# Patient Record
Sex: Male | Born: 1980 | Hispanic: No | Marital: Single | Smoking: Current every day smoker
Health system: Southern US, Community
[De-identification: ages and names within clinical notes are randomized; demographics above are authoritative.]

---

## 2009-09-22 ENCOUNTER — Emergency Department (HOSPITAL_COMMUNITY): Admission: EM | Admit: 2009-09-22 | Discharge: 2009-09-22 | Payer: Self-pay | Admitting: Emergency Medicine

## 2010-09-19 ENCOUNTER — Emergency Department: Payer: Self-pay | Admitting: Emergency Medicine

## 2010-11-02 ENCOUNTER — Emergency Department: Payer: Self-pay | Admitting: Emergency Medicine

## 2010-11-18 ENCOUNTER — Emergency Department: Payer: Self-pay | Admitting: Emergency Medicine

## 2010-12-25 ENCOUNTER — Emergency Department: Payer: Self-pay | Admitting: Unknown Physician Specialty

## 2011-06-26 ENCOUNTER — Emergency Department: Payer: Self-pay | Admitting: *Deleted

## 2011-07-11 ENCOUNTER — Emergency Department: Payer: Self-pay | Admitting: Unknown Physician Specialty

## 2011-09-06 ENCOUNTER — Emergency Department: Payer: Self-pay | Admitting: Internal Medicine

## 2011-11-12 ENCOUNTER — Emergency Department: Payer: Self-pay | Admitting: Emergency Medicine

## 2011-11-12 LAB — WET PREP, GENITAL

## 2012-01-13 ENCOUNTER — Emergency Department: Payer: Self-pay | Admitting: Internal Medicine

## 2012-01-14 ENCOUNTER — Emergency Department: Payer: Self-pay | Admitting: Emergency Medicine

## 2012-01-14 LAB — COMPREHENSIVE METABOLIC PANEL
Alkaline Phosphatase: 85 U/L (ref 50–136)
Anion Gap: 6 — ABNORMAL LOW (ref 7–16)
Calcium, Total: 9.1 mg/dL (ref 8.5–10.1)
Co2: 26 mmol/L (ref 21–32)
Creatinine: 1.01 mg/dL (ref 0.60–1.30)
EGFR (African American): >60
Potassium: 3.7 mmol/L (ref 3.5–5.1)
Sodium: 139 mmol/L (ref 136–145)
Total Protein: 7.4 g/dL (ref 6.4–8.2)

## 2012-01-14 LAB — URINALYSIS, COMPLETE
Glucose,UR: NEGATIVE mg/dL (ref 0–75)
Ketone: NEGATIVE
Leukocyte Esterase: NEGATIVE
Ph: 6 (ref 4.5–8.0)
Protein: NEGATIVE
RBC,UR: NONE SEEN /HPF (ref 0–5)

## 2012-01-14 LAB — CBC
HCT: 45.6 % (ref 40.0–52.0)
MCHC: 34.3 g/dL (ref 32.0–36.0)
MCV: 86 fL (ref 80–100)
RDW: 13.4 % (ref 11.5–14.5)

## 2012-01-14 LAB — LIPASE, BLOOD: Lipase: 82 U/L (ref 73–393)

## 2012-05-14 ENCOUNTER — Emergency Department: Payer: Self-pay | Admitting: Unknown Physician Specialty

## 2012-06-25 ENCOUNTER — Emergency Department: Payer: Self-pay | Admitting: Unknown Physician Specialty

## 2012-06-25 LAB — RAPID INFLUENZA A&B ANTIGENS

## 2012-12-11 ENCOUNTER — Emergency Department: Payer: Self-pay | Admitting: Emergency Medicine

## 2012-12-11 LAB — CBC
HCT: 44.6 % (ref 40.0–52.0)
HGB: 15.4 g/dL (ref 13.0–18.0)
MCH: 29.5 pg (ref 26.0–34.0)
MCV: 85 fL (ref 80–100)
Platelet: 180 10*3/uL (ref 150–440)
RBC: 5.22 10*6/uL (ref 4.40–5.90)
RDW: 13.6 % (ref 11.5–14.5)
WBC: 11.1 10*3/uL — ABNORMAL HIGH (ref 3.8–10.6)

## 2012-12-11 LAB — URINALYSIS, COMPLETE
Bilirubin,UR: NEGATIVE
Glucose,UR: NEGATIVE mg/dL (ref 0–75)
Ketone: NEGATIVE
Ph: 6 (ref 4.5–8.0)
Protein: NEGATIVE
RBC,UR: <1 /HPF (ref 0–5)
Specific Gravity: 1.023 (ref 1.003–1.030)
WBC UR: <1 /HPF (ref 0–5)

## 2012-12-11 LAB — BASIC METABOLIC PANEL
BUN: 20 mg/dL — ABNORMAL HIGH (ref 7–18)
Creatinine: 1.06 mg/dL (ref 0.60–1.30)
EGFR (African American): >60
EGFR (Non-African Amer.): >60
Glucose: 98 mg/dL (ref 65–99)
Osmolality: 276 (ref 275–301)
Potassium: 3.8 mmol/L (ref 3.5–5.1)
Sodium: 137 mmol/L (ref 136–145)

## 2012-12-14 ENCOUNTER — Emergency Department: Payer: Self-pay | Admitting: Emergency Medicine

## 2012-12-14 LAB — BASIC METABOLIC PANEL
Anion Gap: 7 (ref 7–16)
BUN: 14 mg/dL (ref 7–18)
Calcium, Total: 8.8 mg/dL (ref 8.5–10.1)
Creatinine: 1.17 mg/dL (ref 0.60–1.30)
EGFR (African American): >60
Potassium: 3.6 mmol/L (ref 3.5–5.1)
Sodium: 139 mmol/L (ref 136–145)

## 2012-12-14 LAB — CBC
HCT: 43 % (ref 40.0–52.0)
MCH: 29.9 pg (ref 26.0–34.0)
MCHC: 35.3 g/dL (ref 32.0–36.0)
MCV: 85 fL (ref 80–100)
Platelet: 166 10*3/uL (ref 150–440)

## 2013-03-18 ENCOUNTER — Emergency Department: Payer: Self-pay | Admitting: Emergency Medicine

## 2013-05-18 ENCOUNTER — Emergency Department: Payer: Self-pay | Admitting: Emergency Medicine

## 2013-05-20 LAB — BETA STREP CULTURE(ARMC)

## 2013-05-27 ENCOUNTER — Emergency Department: Payer: Self-pay | Admitting: Emergency Medicine

## 2013-05-27 LAB — CBC WITH DIFFERENTIAL/PLATELET
Basophil #: 0 10*3/uL (ref 0.0–0.1)
Basophil %: 0.4 %
Eosinophil #: 0.3 10*3/uL (ref 0.0–0.7)
Eosinophil %: 3.3 %
HCT: 44.1 % (ref 40.0–52.0)
HGB: 15.1 g/dL (ref 13.0–18.0)
Lymphocyte #: 2.6 10*3/uL (ref 1.0–3.6)
Lymphocyte %: 30.3 %
MCH: 29 pg (ref 26.0–34.0)
MCHC: 34.3 g/dL (ref 32.0–36.0)
MCV: 85 fL (ref 80–100)
MONOS PCT: 5.1 %
Monocyte #: 0.4 x10 3/mm (ref 0.2–1.0)
NEUTROS ABS: 5.2 10*3/uL (ref 1.4–6.5)
Neutrophil %: 60.9 %
Platelet: 162 10*3/uL (ref 150–440)
RBC: 5.22 10*6/uL (ref 4.40–5.90)
RDW: 13.4 % (ref 11.5–14.5)
WBC: 8.6 10*3/uL (ref 3.8–10.6)

## 2013-05-27 LAB — BASIC METABOLIC PANEL
ANION GAP: 5 — AB (ref 7–16)
BUN: 12 mg/dL (ref 7–18)
Calcium, Total: 8.9 mg/dL (ref 8.5–10.1)
Chloride: 107 mmol/L (ref 98–107)
Co2: 26 mmol/L (ref 21–32)
Creatinine: 0.91 mg/dL (ref 0.60–1.30)
EGFR (Non-African Amer.): >60
Glucose: 85 mg/dL (ref 65–99)
Osmolality: 275 (ref 275–301)
Potassium: 3.9 mmol/L (ref 3.5–5.1)
Sodium: 138 mmol/L (ref 136–145)

## 2013-05-27 LAB — MONONUCLEOSIS SCREEN: Mono Test: NEGATIVE

## 2013-05-29 LAB — BETA STREP CULTURE(ARMC)

## 2013-07-01 ENCOUNTER — Ambulatory Visit: Payer: Self-pay | Admitting: Otolaryngology

## 2013-07-16 ENCOUNTER — Ambulatory Visit: Payer: Self-pay | Admitting: Family Medicine

## 2014-02-22 ENCOUNTER — Emergency Department: Payer: Self-pay | Admitting: Emergency Medicine

## 2014-03-06 ENCOUNTER — Emergency Department: Payer: Self-pay | Admitting: Emergency Medicine

## 2014-03-13 ENCOUNTER — Emergency Department: Payer: Self-pay | Admitting: Emergency Medicine

## 2014-03-13 ENCOUNTER — Ambulatory Visit: Payer: Self-pay | Admitting: Emergency Medicine

## 2014-03-13 LAB — BASIC METABOLIC PANEL
Anion Gap: 8 (ref 7–16)
BUN: 11 mg/dL (ref 7–18)
CHLORIDE: 108 mmol/L — AB (ref 98–107)
CO2: 25 mmol/L (ref 21–32)
CREATININE: 1.15 mg/dL (ref 0.60–1.30)
Calcium, Total: 8.4 mg/dL — ABNORMAL LOW (ref 8.5–10.1)
EGFR (African American): >60
Glucose: 93 mg/dL (ref 65–99)
Osmolality: 280 (ref 275–301)
Potassium: 3.8 mmol/L (ref 3.5–5.1)
Sodium: 141 mmol/L (ref 136–145)

## 2014-03-13 LAB — CBC
HCT: 46.4 % (ref 40.0–52.0)
HGB: 15.5 g/dL (ref 13.0–18.0)
MCH: 28.9 pg (ref 26.0–34.0)
MCHC: 33.3 g/dL (ref 32.0–36.0)
MCV: 87 fL (ref 80–100)
Platelet: 164 10*3/uL (ref 150–440)
RBC: 5.35 10*6/uL (ref 4.40–5.90)
RDW: 13.9 % (ref 11.5–14.5)
WBC: 12.1 10*3/uL — ABNORMAL HIGH (ref 3.8–10.6)

## 2014-03-13 LAB — URINALYSIS, COMPLETE
Bacteria: NONE SEEN
Bilirubin,UR: NEGATIVE
Blood: NEGATIVE
Glucose,UR: NEGATIVE mg/dL (ref 0–75)
Hyaline Cast: 2
Ketone: NEGATIVE
Leukocyte Esterase: NEGATIVE
Nitrite: NEGATIVE
Ph: 5 (ref 4.5–8.0)
Protein: NEGATIVE
RBC,UR: NONE SEEN /HPF (ref 0–5)
SPECIFIC GRAVITY: 1.021 (ref 1.003–1.030)
WBC UR: 1 /HPF (ref 0–5)

## 2014-03-13 LAB — CK: CK, TOTAL: 92 U/L

## 2014-03-13 LAB — TROPONIN I: Troponin-I: <0.02 ng/mL

## 2014-04-30 ENCOUNTER — Emergency Department: Payer: Self-pay | Admitting: Emergency Medicine

## 2014-05-12 ENCOUNTER — Ambulatory Visit: Payer: Self-pay | Admitting: Otolaryngology

## 2014-08-13 ENCOUNTER — Emergency Department
Admit: 2014-08-13 | Disposition: A | Discharge status: Home / Self Care | Payer: Self-pay | Admitting: Emergency Medicine

## 2015-08-14 ENCOUNTER — Encounter: Payer: Self-pay | Admitting: Emergency Medicine

## 2015-08-14 ENCOUNTER — Emergency Department
Admission: EM | Admit: 2015-08-14 | Discharge: 2015-08-14 | Disposition: A | Discharge status: Home / Self Care | Payer: Self-pay | Attending: Student | Admitting: Student

## 2015-08-14 DIAGNOSIS — H838X1 Other specified diseases of right inner ear: Secondary | ICD-10-CM | POA: Insufficient documentation

## 2015-08-14 DIAGNOSIS — H938X1 Other specified disorders of right ear: Secondary | ICD-10-CM

## 2015-08-14 DIAGNOSIS — H9201 Otalgia, right ear: Secondary | ICD-10-CM | POA: Insufficient documentation

## 2015-08-14 MED ORDER — PREDNISONE 10 MG PO TABS: 10.0000 mg | ORAL_TABLET | Freq: Every day | ORAL | Status: DC

## 2015-08-14 NOTE — ED Provider Notes (Signed)
CSN: [id number]     Arrival date & time 08/14/15  [id number] History   First MD Initiated Contact with Patient 08/14/15 [phone]     Chief Complaint  Patient presents with  . Otalgia     (Consider location/radiation/quality/duration/timing/severity/associated sxs/prior Treatment) HPI  35 year old male presents emergency department for evaluation of right ear pain. Ear pain has been present for 7 days. He describes 10 out of 10 pain and decreased hearing in the right ear. He denies any fevers. States she's had a lot of sinus congestion. Sinus congestion has been intermittent for over a year. Denies any cough, chest pain, shortness of breath. No drainage from the right ear. He has not taken any medications for pain or congestion.  History reviewed. No pertinent past medical history. History reviewed. No pertinent past surgical history. History reviewed. No pertinent family history. Social History  Substance Use Topics  . Smoking status: None  . Smokeless tobacco: None  . Alcohol Use: None    Review of Systems  Constitutional: Negative.  Negative for fever, chills, activity change and appetite change.  HENT: Positive for congestion, ear pain and sinus pressure. Negative for mouth sores, rhinorrhea, sore throat and trouble swallowing.   Eyes: Negative for photophobia, pain and discharge.  Respiratory: Negative for cough, chest tightness and shortness of breath.   Cardiovascular: Negative for chest pain and leg swelling.  Gastrointestinal: Negative for nausea, vomiting, abdominal pain, diarrhea and abdominal distention.  Genitourinary: Negative for dysuria and difficulty urinating.  Musculoskeletal: Negative for back pain, arthralgias and gait problem.  Skin: Negative for color change and rash.  Neurological: Negative for dizziness and headaches.  Hematological: Negative for adenopathy.  Psychiatric/Behavioral: Negative for behavioral problems and agitation.      Allergies  Review of  patient's allergies indicates not on file.  Home Medications   Prior to Admission medications   Medication Sig Start Date End Date Taking? Authorizing Provider  predniSONE (DELTASONE) 10 MG tablet Take 1 tablet (10 mg total) by mouth daily. 6,5,4,3,2,1 six day taper 08/14/15   Duanne Guess, PA-C   BP 132/90 mmHg  Pulse 68  Temp(Src) 97.4 F (36.3 C) (Oral)  Resp 18  Ht 6\' 1"  (1.854 m)  Wt 86.183 kg  BMI 25.07 kg/m2  SpO2 98% Physical Exam  Constitutional: He is oriented to person, place, and time. He appears well-developed and well-nourished.  HENT:  Head: Normocephalic and atraumatic.  Right Ear: External ear normal. No drainage. No mastoid tenderness. Tympanic membrane is not injected, not perforated and not erythematous. A middle ear effusion (serous, no signs of infected fluid.) is present.  Left Ear: Hearing, tympanic membrane, external ear and ear canal normal.  Nose: Nose normal.  Mouth/Throat: Oropharynx is clear and moist. No oropharyngeal exudate.  Eyes: Conjunctivae and EOM are normal. Pupils are equal, round, and reactive to light.  Neck: Normal range of motion. Neck supple.  Cardiovascular: Normal rate, regular rhythm and intact distal pulses.   Pulmonary/Chest: Effort normal and breath sounds normal. No respiratory distress. He has no wheezes. He has no rales.  Musculoskeletal: Normal range of motion. He exhibits no edema or tenderness.  Lymphadenopathy:    He has no cervical adenopathy.  Neurological: He is alert and oriented to person, place, and time.  Skin: Skin is warm and dry.  Psychiatric: He has a normal mood and affect. His behavior is normal. Judgment and thought content normal.    ED Course  Procedures (including critical care time) Labs Review Labs  Reviewed - No data to display  Imaging Review No results found. I have personally reviewed and evaluated these images and lab results as part of my medical decision-making.   EKG  Interpretation None      MDM   Final diagnoses:  Otalgia, right  Congestion of right ear    35 year old male with sinus congestion, pressure in the right ear. No signs of infection. He is placed on a 6 day steroid taper, told to purchase over-the-counter decongestant medication. He will increase his fluids. Educated on signs and symptoms to return to the emergency department for.    Duanne Guess, PA-C 08/14/15 0725  Joanne Gavel, MD 08/14/15 [phone]

## 2015-08-14 NOTE — ED Notes (Signed)
NAD noted at time of D/C. Pt denies questions or concerns. Pt ambulatory to the lobby at this time.  

## 2015-08-14 NOTE — Discharge Instructions (Signed)
Earache An earache, also called otalgia, can be caused by many things. Pain from an earache can be sharp, dull, or burning. The pain may be temporary or constant. Earaches can be caused by problems with the ear, such as infection in either the middle ear or the ear canal, injury, impacted ear wax, middle ear pressure, or a foreign body in the ear. Ear pain can also result from problems in other areas. This is called referred pain. For example, pain can come from a sore throat, a tooth infection, or problems with the jaw or the joint between the jaw and the skull (temporomandibular joint, or TMJ). The cause of an earache is not always easy to identify. Watchful waiting may be appropriate for some earaches until a clear cause of the pain can be found. HOME CARE INSTRUCTIONS Watch your condition for any changes. The following actions may help to lessen any discomfort that you are feeling:  Take medicines only as directed by your health care provider. This includes ear drops.  Apply ice to your outer ear to help reduce pain.  Put ice in a plastic bag.  Place a towel between your skin and the bag.  Leave the ice on for 20 minutes, 2-3 times per day.  Do not put anything in your ear other than medicine that is prescribed by your health care provider.  Try resting in an upright position instead of lying down. This may help to reduce pressure in the middle ear and relieve pain.  Chew gum if it helps to relieve your ear pain.  Control any allergies that you have.  Keep all follow-up visits as directed by your health care provider. This is important. SEEK MEDICAL CARE IF:  Your pain does not improve within 2 days.  You have a fever.  You have new or worsening symptoms. SEEK IMMEDIATE MEDICAL CARE IF:  You have a severe headache.  You have a stiff neck.  You have difficulty swallowing.  You have redness or swelling behind your ear.  You have drainage from your ear.  You have hearing  loss.  You feel dizzy.   This information is not intended to replace advice given to you by your health care provider. Make sure you discuss any questions you have with your health care provider.   Document Released: 12/16/2003 Document Revised: 05/21/2014 Document Reviewed: 11/29/2013 Elsevier Interactive Patient Education 2016 [employer].   Please take prednisone and over-the-counter decongestant medication such as pseudoephedrine or phenylephrine. Please increase fluids. Return to the ER for any fevers, increased pain worsening symptoms urgent changes in her health.

## 2015-08-18 ENCOUNTER — Emergency Department
Admission: EM | Admit: 2015-08-18 | Discharge: 2015-08-18 | Disposition: A | Discharge status: Home / Self Care | Payer: Self-pay | Attending: Emergency Medicine | Admitting: Emergency Medicine

## 2015-08-18 ENCOUNTER — Encounter: Payer: Self-pay | Admitting: Urgent Care

## 2015-08-18 DIAGNOSIS — H6501 Acute serous otitis media, right ear: Secondary | ICD-10-CM | POA: Insufficient documentation

## 2015-08-18 MED ORDER — AMOXICILLIN 500 MG PO TABS: 500.0000 mg | ORAL_TABLET | Freq: Three times a day (TID) | ORAL | Status: DC

## 2015-08-18 MED ORDER — FLUTICASONE PROPIONATE 50 MCG/ACT NA SUSP: 2.0000 | Freq: Every day | NASAL | Status: DC

## 2015-08-18 NOTE — ED Notes (Signed)
Patient presents to ED from home with c/o bilateral otalgia. Patient seen here on Saturday for the same and advised that he had fluid be hind his ears. (+) fever at home. Denies drainage. On last dose of medication and is "no better".

## 2015-08-18 NOTE — ED Notes (Signed)
See triage note.  Bilateral ear discomfort   Was seen on sat for same  But thinks it is worse

## 2015-08-18 NOTE — Discharge Instructions (Signed)

## 2015-08-18 NOTE — ED Provider Notes (Signed)
[facility] Emergency Department Provider Note  ____________________________________________  Time seen: Approximately 7:23 AM  I have reviewed the triage vital signs and the nursing notes.   HISTORY  Chief Complaint Otalgia    HPI Erik Francis is a 35 y.o. male , NAD, presents emergency department with continued right ear pain. Was seen in this emergency department 4 days ago for same. Was given prednisone Dosepak and advised to get over-the-counter decongestant. Has been taking the prednisone as well as a over-the-counter medication without any resolution of his symptoms. States he has felt some subjective fevers but hasn't does not have a thermometer at home to check. Does not have any changes in hearing. Has not had any discharge from the ears. No ringing about the ears. Has not had any cold symptoms to include nasal congestion, runny nose, sore throat. No cough. Notes he has a skin sore behind the right ear that is new since his last visit. Does not have insurance and does not have a primary care provider to follow up with.   History reviewed. No pertinent past medical history.  There are no active problems to display for this patient.   History reviewed. No pertinent past surgical history.  Current Outpatient Rx  Name  Route  Sig  Dispense  Refill  . amoxicillin (AMOXIL) 500 MG tablet   Oral   Take 1 tablet (500 mg total) by mouth 3 (three) times daily with meals.   30 tablet   0   . fluticasone (FLONASE) 50 MCG/ACT nasal spray   Each Nare   Place 2 sprays into both nostrils daily.   16 g   0   . predniSONE (DELTASONE) 10 MG tablet   Oral   Take 1 tablet (10 mg total) by mouth daily. 6,5,4,3,2,1 six day taper   21 tablet   0     Allergies Review of patient's allergies indicates no known allergies.  No family history on file.  Social History Social History  Substance Use Topics  . Smoking status: Never Smoker   . Smokeless tobacco:  None  . Alcohol Use: None     Review of Systems  Constitutional: Subjective fever, no chills, fatigue Eyes: No visual changes. No discharge, redness, swelling.  ENT: Right ear pain. No drainage from ears. No sore throat or nasal congestion, runny nose. Cardiovascular: No chest pain. Respiratory: No cough.  Gastrointestinal: No abdominal pain.  No nausea, vomiting.  Musculoskeletal: Negative for general myalgias or neck pain.  Skin: Positive skin sore behind right ear. Negative for rash. Neurological: Negative for headaches, focal weakness or numbness. 10-point ROS otherwise negative.  ____________________________________________   PHYSICAL EXAM:  VITAL SIGNS: ED Triage Vitals  Enc Vitals Group     BP 08/18/15 0655 125/80 mmHg     Pulse Rate 08/18/15 0655 76     Resp 08/18/15 0655 16     Temp 08/18/15 0655 98.5 F (36.9 C)     Temp Source 08/18/15 0655 Oral     SpO2 08/18/15 0655 97 %     Weight 08/18/15 0655 190 lb (86.183 kg)     Height --      Head Cir --      Peak Flow --      Pain Score 08/18/15 0655 8     Pain Loc --      Pain Edu? --      Excl. in [place]? --     Constitutional: Alert and oriented. Well  appearing and in no acute distress. Eyes: Conjunctivae are normal.  Head: Atraumatic. ENT:      Ears: Right TM visualized with trace serous effusion and mild bulging but no erythema or perforation. Left TM visualized within normal limits. Light reflex about the right TM was slightly dull. Light reflex about left TM was normal. Bilateral external ear canals without any erythema, swelling, discharge. No mastoid tenderness bilaterally. No pain with manipulation of the tragus or pinna.      Nose: No congestion/rhinnorhea.      Mouth/Throat: Mucous membranes are moist. Pharynx without erythema, swelling, exudate. Neck: Supple with full range of motion. Hematological/Lymphatic/Immunilogical: No cervical lymphadenopathy but tenderness about the right anterior cervical  line. Cardiovascular:  Good peripheral circulation. Respiratory: Normal respiratory effort without tachypnea or retractions.  Neurologic:  Normal speech and language. No gross focal neurologic deficits are appreciated.  Skin:  4 mm annular skin sores noted behind the right ear. No oozing or weeping. Skin sort is skin turns without erythema.  No rash noted. Psychiatric: Mood and affect are normal. Speech and behavior are normal. Patient exhibits appropriate insight and judgement.   ____________________________________________   LABS (all labs ordered are listed, but only abnormal results are displayed)  Labs Reviewed - No data to display ____________________________________________  EKG  None ____________________________________________  RADIOLOGY  None ____________________________________________    PROCEDURES  Procedure(s) performed: None    Medications - No data to display   ____________________________________________   INITIAL IMPRESSION / ASSESSMENT AND PLAN / ED COURSE  Patient's diagnosis is consistent with right acute otitis media. Patient will be discharged home with prescriptions for amoxicillin and Flonase to be taken as directed. May take Tylenol or ibuprofen as needed for pain. Patient is to follow up with [place] clinic if symptoms persist past this treatment course. Patient is given ED precautions to return to the ED for any worsening or new symptoms.      ____________________________________________  FINAL CLINICAL IMPRESSION(S) / ED DIAGNOSES  Final diagnoses:  Right acute serous otitis media, recurrence not specified      NEW MEDICATIONS STARTED DURING THIS VISIT:  Discharge Medication List as of 08/18/2015  7:32 AM    START taking these medications   Details  amoxicillin (AMOXIL) 500 MG tablet Take 1 tablet (500 mg total) by mouth 3 (three) times daily with meals., Starting 08/18/2015, Until Discontinued, Print     fluticasone (FLONASE) 50 MCG/ACT nasal spray Place 2 sprays into both nostrils daily., Starting 08/18/2015, Until Discontinued, Print             Braxton Feathers, PA-C 08/18/15 0820  Delman Kitten, MD 08/18/15 1544

## 2016-02-06 ENCOUNTER — Encounter: Payer: Self-pay | Admitting: Emergency Medicine

## 2016-02-06 ENCOUNTER — Emergency Department
Admission: EM | Admit: 2016-02-06 | Discharge: 2016-02-06 | Disposition: A | Discharge status: Home / Self Care | Payer: Self-pay | Attending: Emergency Medicine | Admitting: Emergency Medicine

## 2016-02-06 DIAGNOSIS — L259 Unspecified contact dermatitis, unspecified cause: Secondary | ICD-10-CM | POA: Insufficient documentation

## 2016-02-06 MED ORDER — PREDNISONE 10 MG (21) PO TBPK: ORAL_TABLET | ORAL | 0 refills | Status: DC

## 2016-02-06 MED ORDER — HYDROXYZINE HCL 25 MG PO TABS: 25.0000 mg | ORAL_TABLET | Freq: Three times a day (TID) | ORAL | 0 refills | Status: DC | PRN

## 2016-02-06 NOTE — ED Provider Notes (Signed)
[facility] Emergency Department Provider Note  ____________________________________________  Time seen: Approximately 3:14 PM  I have reviewed the triage vital signs and the nursing notes.   HISTORY  Chief Complaint Rash   HPI Erik Francis is a 35 y.o. male who presents to the emergency department for evaluation of vesicular rash on bilateral index fingers x 2 weeks. He was exposed to poison sumac and the rash started soon after. He has used Calamine lotion with some relief.   History reviewed. No pertinent past medical history.  There are no active problems to display for this patient.   History reviewed. No pertinent surgical history.  Prior to Admission medications   Medication Sig Start Date End Date Taking? Authorizing Provider  amoxicillin (AMOXIL) 500 MG tablet Take 1 tablet (500 mg total) by mouth 3 (three) times daily with meals. 08/18/15   Jami L Hagler, PA-C  fluticasone (FLONASE) 50 MCG/ACT nasal spray Place 2 sprays into both nostrils daily. 08/18/15   Jami L Hagler, PA-C  hydrOXYzine (ATARAX/VISTARIL) 25 MG tablet Take 1 tablet (25 mg total) by mouth 3 (three) times daily as needed. 02/06/16   Victorino Dike, FNP  predniSONE (STERAPRED UNI-PAK 21 TAB) 10 MG (21) TBPK tablet Take 6 tablets on day 1 Take 5 tablets on day 2 Take 4 tablets on day 3 Take 3 tablets on day 4 Take 2 tablets on day 5 Take 1 tablet on day 6 02/06/16   Victorino Dike, FNP    Allergies Review of patient's allergies indicates no known allergies.  No family history on file.  Social History Social History  Substance Use Topics  . Smoking status: Never Smoker  . Smokeless tobacco: Not on file  . Alcohol use Not on file    Review of Systems  Constitutional: Negative for fever/chills Respiratory: Negative for shortness of breath. Musculoskeletal: Negative for pain. Skin: Positive for rash Neurological: Negative for headaches, focal weakness or  numbness. ____________________________________________   PHYSICAL EXAM:  VITAL SIGNS:Blood pressure 141/87, heart rate 80, respiratory rate 16, SPO2 99% on room air, temperature 97.8. ED Triage Vitals  Enc Vitals Group     BP      Pulse      Resp      Temp      Temp src      SpO2      Weight      Height      Head Circumference      Peak Flow      Pain Score      Pain Loc      Pain Edu?      Excl. in [place]?      Constitutional: Alert and oriented. Well appearing and in no acute distress. Eyes: Conjunctivae are normal. EOMI. Mouth/Throat: Mucous membranes are moist.   Neck: No stridor. Cardiovascular: Good peripheral circulation. Respiratory: Normal respiratory effort.  No retractions. Lungs clear to auscultation. Musculoskeletal: FROM throughout. Neurologic:  Normal speech and language. No gross focal neurologic deficits are appreciated. Skin:  Mildly erythematous vesicular lesions noted to the dorsal aspects of the index fingers on both hands.  ____________________________________________   LABS (all labs ordered are listed, but only abnormal results are displayed)  Labs Reviewed - No data to display ____________________________________________  EKG   ____________________________________________  RADIOLOGY   ____________________________________________   PROCEDURES  Procedure(s) performed: None ____________________________________________   INITIAL IMPRESSION / ASSESSMENT AND PLAN / ED COURSE  Clinical Course    Pertinent  labs & imaging results that were available during my care of the patient were reviewed by me and considered in my medical decision making (see chart for details).  He will be advised to take the Vistaril and prednisone as prescribed.  He was advised to follow up with dermatology of his choice for symptoms that are not improving over the next week.  He was also advised to return to the emergency department for symptoms that change or  worsen if unable to schedule an appointment.  ____________________________________________   FINAL CLINICAL IMPRESSION(S) / ED DIAGNOSES  Final diagnoses:  Contact dermatitis    New Prescriptions   HYDROXYZINE (ATARAX/VISTARIL) 25 MG TABLET    Take 1 tablet (25 mg total) by mouth 3 (three) times daily as needed.   PREDNISONE (STERAPRED UNI-PAK 21 TAB) 10 MG (21) TBPK TABLET    Take 6 tablets on day 1 Take 5 tablets on day 2 Take 4 tablets on day 3 Take 3 tablets on day 4 Take 2 tablets on day 5 Take 1 tablet on day 6    Note:  This document was prepared using Dragon voice recognition software and may include unintentional dictation errors.    Victorino Dike, FNP 02/06/16 1541    Hinda Kehr, MD 02/06/16 2319

## 2016-02-06 NOTE — ED Triage Notes (Signed)
Pt reports rash to bilateral 1st fingers x1 week.

## 2016-04-07 ENCOUNTER — Emergency Department
Admission: EM | Admit: 2016-04-07 | Discharge: 2016-04-07 | Disposition: A | Discharge status: Home / Self Care | Payer: Self-pay | Attending: Student in an Organized Health Care Education/Training Program | Admitting: Student in an Organized Health Care Education/Training Program

## 2016-04-07 ENCOUNTER — Emergency Department: Payer: Self-pay

## 2016-04-07 ENCOUNTER — Encounter: Payer: Self-pay | Admitting: Emergency Medicine

## 2016-04-07 DIAGNOSIS — R221 Localized swelling, mass and lump, neck: Secondary | ICD-10-CM

## 2016-04-07 DIAGNOSIS — Q892 Congenital malformations of other endocrine glands: Secondary | ICD-10-CM

## 2016-04-07 DIAGNOSIS — K148 Other diseases of tongue: Secondary | ICD-10-CM | POA: Insufficient documentation

## 2016-04-07 DIAGNOSIS — Z79899 Other long term (current) drug therapy: Secondary | ICD-10-CM | POA: Insufficient documentation

## 2016-04-07 DIAGNOSIS — J0100 Acute maxillary sinusitis, unspecified: Secondary | ICD-10-CM | POA: Insufficient documentation

## 2016-04-07 DIAGNOSIS — F17210 Nicotine dependence, cigarettes, uncomplicated: Secondary | ICD-10-CM | POA: Insufficient documentation

## 2016-04-07 DIAGNOSIS — M542 Cervicalgia: Secondary | ICD-10-CM

## 2016-04-07 LAB — BASIC METABOLIC PANEL
Anion gap: 5 (ref 5–15)
BUN: 15 mg/dL (ref 6–20)
CHLORIDE: 105 mmol/L (ref 101–111)
CO2: 26 mmol/L (ref 22–32)
Calcium: 10.1 mg/dL (ref 8.9–10.3)
Creatinine, Ser: 0.93 mg/dL (ref 0.61–1.24)
GFR calc Af Amer: >60 mL/min (ref >60)
GFR calc non Af Amer: >60 mL/min (ref >60)
GLUCOSE: 100 mg/dL — AB (ref 65–99)
Potassium: 4.5 mmol/L (ref 3.5–5.1)
Sodium: 136 mmol/L (ref 135–145)

## 2016-04-07 LAB — CBC WITH DIFFERENTIAL/PLATELET
Basophils Absolute: 0.1 10*3/uL (ref 0–0.1)
Basophils Relative: 1 %
EOS PCT: 5 %
Eosinophils Absolute: 0.5 10*3/uL (ref 0–0.7)
HCT: 47.7 % (ref 40.0–52.0)
HEMOGLOBIN: 16.5 g/dL (ref 13.0–18.0)
LYMPHS ABS: 3 10*3/uL (ref 1.0–3.6)
LYMPHS PCT: 32 %
MCH: 29.4 pg (ref 26.0–34.0)
MCHC: 34.7 g/dL (ref 32.0–36.0)
MCV: 84.7 fL (ref 80.0–100.0)
MONOS PCT: 6 %
Monocytes Absolute: 0.6 10*3/uL (ref 0.2–1.0)
Neutro Abs: 5.2 10*3/uL (ref 1.4–6.5)
Neutrophils Relative %: 56 %
PLATELETS: 169 10*3/uL (ref 150–440)
RBC: 5.63 MIL/uL (ref 4.40–5.90)
RDW: 13.5 % (ref 11.5–14.5)
WBC: 9.3 10*3/uL (ref 3.8–10.6)

## 2016-04-07 MED ORDER — DEXAMETHASONE SODIUM PHOSPHATE 10 MG/ML IJ SOLN: 10.0000 mg | Freq: Once | INTRAMUSCULAR | Status: DC

## 2016-04-07 MED ORDER — AMOXICILLIN 500 MG PO CAPS
500.0000 mg | ORAL_CAPSULE | Freq: Once | ORAL | Status: AC
  Administered 2016-04-07: 500 mg via ORAL
  Filled 2016-04-07: qty 1

## 2016-04-07 MED ORDER — IOPAMIDOL (ISOVUE-300) INJECTION 61%
75.0000 mL | Freq: Once | INTRAVENOUS | Status: AC | PRN
  Administered 2016-04-07: 75 mL via INTRAVENOUS
  Filled 2016-04-07: qty 75

## 2016-04-07 MED ORDER — AMOXICILLIN 500 MG PO CAPS: 500.0000 mg | ORAL_CAPSULE | Freq: Three times a day (TID) | ORAL | 0 refills | Status: DC

## 2016-04-07 MED ORDER — PREDNISONE 20 MG PO TABS
60.0000 mg | ORAL_TABLET | Freq: Once | ORAL | Status: AC
  Administered 2016-04-07: 60 mg via ORAL
  Filled 2016-04-07: qty 3

## 2016-04-07 MED ORDER — PREDNISONE 10 MG (21) PO TBPK: ORAL_TABLET | ORAL | 0 refills | Status: DC

## 2016-04-07 NOTE — ED Provider Notes (Signed)
[facility] Emergency Department Provider Note ____________________________________________  Time seen: 0714  I have reviewed the triage vital signs and the nursing notes.  HISTORY  Chief Complaint  URI  HPI Erik Francis is a 35 y.o. male presents to the ED for evaluation of URI symptoms including runny nose, mild cough, and postnasal drainage. He denies any outright sore throat, fevers chills, sweats, or cough. He does report tenderness to the left side of his neck, as well as some fullness in the same area. He has a previous history of a left-sided neck soft tissue swelling that has been evaluated by CT scan twice in 2015. Both scans were negative for any findings to correlate to the patient's subjective complaint.  History reviewed. No pertinent past medical history.  There are no active problems to display for this patient.  History reviewed. No pertinent surgical history.  Prior to Admission medications   Medication Sig Start Date End Date Taking? Authorizing Provider  amoxicillin (AMOXIL) 500 MG capsule Take 1 capsule (500 mg total) by mouth 3 (three) times daily. 04/07/16   Joslyne Marshburn V Bacon Jocelynne Duquette, PA-C  fluticasone (FLONASE) 50 MCG/ACT nasal spray Place 2 sprays into both nostrils daily. 08/18/15   Jami L Hagler, PA-C  hydrOXYzine (ATARAX/VISTARIL) 25 MG tablet Take 1 tablet (25 mg total) by mouth 3 (three) times daily as needed. 02/06/16   Victorino Dike, FNP  predniSONE (STERAPRED UNI-PAK 21 TAB) 10 MG (21) TBPK tablet 6-day taper as directed. 04/07/16   Dannielle Karvonen Lakaisha Danish, PA-C    Allergies Patient has no known allergies.  No family history on file.  Social History Social History  Substance Use Topics  . Smoking status: Current Every Day Smoker    Types: E-cigarettes  . Smokeless tobacco: Never Used  . Alcohol use No    Review of Systems  Constitutional: Negative for fever. Eyes: Negative for visual changes. ENT: Negative for sore  throat. Sinus congestion  Cardiovascular: Negative for chest pain. Respiratory: Negative for shortness of breath. Gastrointestinal: Negative for abdominal pain, vomiting and diarrhea. Musculoskeletal: Negative for back pain. Right neck pain and fullness as above.  Skin: Negative for rash. Neurological: Negative for headaches, focal weakness or numbness. ____________________________________________  PHYSICAL EXAM:  VITAL SIGNS: ED Triage Vitals  Enc Vitals Group     BP 04/07/16 0654 118/70     Pulse Rate 04/07/16 0654 78     Resp 04/07/16 0654 18     Temp 04/07/16 0654 97.6 F (36.4 C)     Temp Source 04/07/16 0654 Oral     SpO2 04/07/16 0654 96 %     Weight 04/07/16 0656 205 lb (93 kg)     Height 04/07/16 0656 6' (1.829 m)     Head Circumference --      Peak Flow --      Pain Score 04/07/16 0656 8     Pain Loc --      Pain Edu? --      Excl. in [place]? --     Constitutional: Alert and oriented. Well appearing and in no distress. Head: Normocephalic and atraumatic. Eyes: Conjunctivae are normal. PERRL. Normal extraocular movements Ears: Canals clear. TMs intact bilaterally. Nose: No congestion/rhinorrhea/epistaxis. Mouth/Throat: Mucous membranes are moist. Uvula is midline and tonsils are flat. No oropharyngeal fullness or erythema is appreciated. No brawny sublingual or submandibular mass or swelling is appreciated. Neck: Supple. No thyromegaly, cyst, lesion, or mass.  Hematological/Lymphatic/Immunological: No cervical lymphadenopathy. Cardiovascular: Normal rate,  regular rhythm. Normal distal pulses. Respiratory: Normal respiratory effort. No wheezes/rales/rhonchi. Gastrointestinal: Soft and nontender. No distention. Musculoskeletal: Nontender with normal range of motion in all extremities.  Neurologic:  Normal gait without ataxia. Normal speech and language. No gross focal neurologic deficits are appreciated. Skin:  Skin is warm, dry and intact. No rash noted. Psychiatric:  Mood and affect are normal. Patient exhibits appropriate insight and judgment. Patient is slightly anxious about the underlying causes of his neck pain. ____________________________________________   LABS (pertinent positives/negatives) Labs Reviewed  BASIC METABOLIC PANEL - Abnormal; Notable for the following:       Result Value   Glucose, Bld 100 (*)    All other components within normal limits  CBC WITH DIFFERENTIAL/PLATELET  ____________________________________________   RADIOLOGY  US Soft Tissue Neck IMPRESSION: Normal submandibular space lymph nodes are demonstrated. No right submandibular space sonographic abnormality.  If symptoms progress or are severe, a repeat Neck CT with IV contrast would be recommended.  CT Neck w/ contast IMPRESSION: 1. Continued stable and normal CT appearance of the neck. 2. No focal inflammation or lymphadenopathy. No abnormality identified in the right submandibular area of clinical concern. The right submandibular gland appears stable and within normal limits. ____________________________________________  PROCEDURES  Prednisone 60 mg PO Amoxicillin 500 mg PO ____________________________________________  INITIAL IMPRESSION / ASSESSMENT AND PLAN / ED COURSE  Patient is again reassured by his negative ultrasound and CT contrast study to the neck. He is discharged with prescription for azithromycin and prednisone dose as directed. His symptoms may represent a thyroglossal cyst however imaging still not able to determine the exact cause of his discomfort. He is referred to ENT for further evaluation and management. The patient seems to be reassured by the findings and agreeable with the treatment and follow-up plan. Return precautions are reviewed.  Clinical Course    ____________________________________________  FINAL CLINICAL IMPRESSION(S) / ED DIAGNOSES  Final diagnoses:  Localized swelling, mass and lump, neck  Neck pain on left  side  Acute maxillary sinusitis, recurrence not specified  Thyroglossal duct cyst      Melvenia Needles, PA-C 04/07/16 [place], MD 04/07/16 1639

## 2016-04-07 NOTE — ED Triage Notes (Signed)
Pt states that he has had an upper respiratory infection for weeks. Pt states that his gland is swollen and that this morning it became unbearable. Pt is ambulatory to triage with NAD noted.

## 2016-04-07 NOTE — Discharge Instructions (Signed)
You exam, labs, ultrasound, and CT scan were negative today for any serious or life-threatening findings. You will be treated for inflammation and infection to the sinuses. Take the steroid and antibiotic as directed. Follow-up with Dr. Pryor Ochoa in ENT for further evaluation. Return to the ED as needed.

## 2016-08-21 ENCOUNTER — Emergency Department
Admission: EM | Admit: 2016-08-21 | Discharge: 2016-08-21 | Disposition: A | Discharge status: Home / Self Care | Payer: Self-pay | Attending: Emergency Medicine | Admitting: Emergency Medicine

## 2016-08-21 ENCOUNTER — Encounter: Payer: Self-pay | Admitting: Emergency Medicine

## 2016-08-21 ENCOUNTER — Emergency Department: Payer: Self-pay

## 2016-08-21 DIAGNOSIS — Z79899 Other long term (current) drug therapy: Secondary | ICD-10-CM | POA: Insufficient documentation

## 2016-08-21 DIAGNOSIS — R609 Edema, unspecified: Secondary | ICD-10-CM | POA: Insufficient documentation

## 2016-08-21 DIAGNOSIS — F17210 Nicotine dependence, cigarettes, uncomplicated: Secondary | ICD-10-CM | POA: Insufficient documentation

## 2016-08-21 DIAGNOSIS — R109 Unspecified abdominal pain: Secondary | ICD-10-CM | POA: Insufficient documentation

## 2016-08-21 LAB — COMPREHENSIVE METABOLIC PANEL
ALK PHOS: 82 U/L (ref 38–126)
ALT: 36 U/L (ref 17–63)
AST: 31 U/L (ref 15–41)
Albumin: 4.6 g/dL (ref 3.5–5.0)
Anion gap: 7 (ref 5–15)
BUN: 15 mg/dL (ref 6–20)
CALCIUM: 9.6 mg/dL (ref 8.9–10.3)
CHLORIDE: 102 mmol/L (ref 101–111)
CO2: 26 mmol/L (ref 22–32)
CREATININE: 1.07 mg/dL (ref 0.61–1.24)
GFR calc Af Amer: >60 mL/min (ref >60)
Glucose, Bld: 125 mg/dL — ABNORMAL HIGH (ref 65–99)
Potassium: 4.1 mmol/L (ref 3.5–5.1)
Sodium: 135 mmol/L (ref 135–145)
Total Bilirubin: 1 mg/dL (ref 0.3–1.2)
Total Protein: 7.5 g/dL (ref 6.5–8.1)

## 2016-08-21 LAB — CBC
HCT: 48.9 % (ref 40.0–52.0)
Hemoglobin: 17 g/dL (ref 13.0–18.0)
MCH: 28.9 pg (ref 26.0–34.0)
MCHC: 34.8 g/dL (ref 32.0–36.0)
MCV: 83.2 fL (ref 80.0–100.0)
PLATELETS: 176 10*3/uL (ref 150–440)
RBC: 5.88 MIL/uL (ref 4.40–5.90)
RDW: 13.6 % (ref 11.5–14.5)
WBC: 9.5 10*3/uL (ref 3.8–10.6)

## 2016-08-21 LAB — URINALYSIS, COMPLETE (UACMP) WITH MICROSCOPIC
Bacteria, UA: NONE SEEN
Bilirubin Urine: NEGATIVE
Glucose, UA: NEGATIVE mg/dL
Hgb urine dipstick: NEGATIVE
Ketones, ur: NEGATIVE mg/dL
Leukocytes, UA: NEGATIVE
Nitrite: NEGATIVE
PH: 5 (ref 5.0–8.0)
Protein, ur: NEGATIVE mg/dL
RBC / HPF: NONE SEEN RBC/hpf (ref 0–5)
SPECIFIC GRAVITY, URINE: 1.019 (ref 1.005–1.030)
SQUAMOUS EPITHELIAL / LPF: NONE SEEN

## 2016-08-21 LAB — LIPASE, BLOOD: LIPASE: 22 U/L (ref 11–51)

## 2016-08-21 MED ORDER — HYDROCORTISONE ACETATE 25 MG RE SUPP: 25.0000 mg | Freq: Two times a day (BID) | RECTAL | 1 refills | Status: AC

## 2016-08-21 NOTE — ED Triage Notes (Signed)
Says left lfank pain since last week.  Now with blood in urine.  Also swelling buttocks

## 2016-08-21 NOTE — ED Notes (Signed)
Patient transported to CT 

## 2016-08-21 NOTE — ED Provider Notes (Signed)
[facility] Emergency Department Provider Note       Time seen: ----------------------------------------- 9:30 AM on 08/21/2016 -----------------------------------------     I have reviewed the triage vital signs and the nursing notes.   HISTORY   Chief Complaint Flank Pain and Hematuria    HPI Erik Francis is a 36 y.o. male who presents to the ED for left-sided flank pain since last week. Patient has noted some blood in his urine that he thinks may be coming from rectal bleeding. He thought he has seen some swelling in his perirectal area. He denies fevers, chills, chest pain, shortness of breath, vomiting or diarrhea. He has never had these symptoms before.   History reviewed. No pertinent past medical history.  There are no active problems to display for this patient.   History reviewed. No pertinent surgical history.  Allergies Patient has no known allergies.  Social History Social History  Substance Use Topics  . Smoking status: Current Every Day Smoker    Types: E-cigarettes  . Smokeless tobacco: Never Used  . Alcohol use No    Review of Systems Constitutional: Negative for fever. Cardiovascular: Negative for chest pain. Respiratory: Negative for shortness of breath. Gastrointestinal: Positive for flank pain Genitourinary: Positive for possible hematuria Musculoskeletal: Negative for back pain. Positive for gluteal swelling Skin: Negative for rash. Neurological: Negative for headaches, focal weakness or numbness.  10-point ROS otherwise negative.  ____________________________________________   PHYSICAL EXAM:  VITAL SIGNS: ED Triage Vitals  Enc Vitals Group     BP 08/21/16 0713 (!) 151/98     Pulse Rate 08/21/16 0713 80     Resp 08/21/16 0713 16     Temp 08/21/16 0713 98.2 F (36.8 C)     Temp Source 08/21/16 0713 Oral     SpO2 08/21/16 0713 97 %     Weight 08/21/16 0714 200 lb (90.7 kg)     Height 08/21/16 0714 6'  1" (1.854 m)     Head Circumference --      Peak Flow --      Pain Score 08/21/16 0713 5     Pain Loc --      Pain Edu? --      Excl. in [place]? --     Constitutional: Alert and oriented. Well appearing and in no distress. Eyes: Conjunctivae are normal. PERRL. Normal extraocular movements. ENT   Head: Normocephalic and atraumatic.   Nose: No congestion/rhinnorhea.   Mouth/Throat: Mucous membranes are moist.   Neck: No stridor. Cardiovascular: Normal rate, regular rhythm. No murmurs, rubs, or gallops. Respiratory: Normal respiratory effort without tachypnea nor retractions. Breath sounds are clear and equal bilaterally. No wheezes/rales/rhonchi. Gastrointestinal: Soft and nontender. Normal bowel sounds Rectal: Normal examination, no hemorrhoids or swelling Musculoskeletal: Nontender with normal range of motion in extremities. No lower extremity tenderness nor edema. Neurologic:  Normal speech and language. No gross focal neurologic deficits are appreciated.  Skin:  Skin is warm, dry and intact. No rash noted. Psychiatric: Mood and affect are normal. Speech and behavior are normal.  ____________________________________________  ED COURSE:  Pertinent labs & imaging results that were available during my care of the patient were reviewed by me and considered in my medical decision making (see chart for details). Patient presents for flank pain, we will assess with labs and imaging as indicated.   Procedures ____________________________________________   LABS (pertinent positives/negatives)  Labs Reviewed  COMPREHENSIVE METABOLIC PANEL - Abnormal; Notable for the following:  Result Value   Glucose, Bld 125 (*)    All other components within normal limits  URINALYSIS, COMPLETE (UACMP) WITH MICROSCOPIC - Abnormal; Notable for the following:    Color, Urine YELLOW (*)    APPearance CLEAR (*)    All other components within normal limits  LIPASE, BLOOD  CBC     RADIOLOGY  CT renal protocol IMPRESSION: No evidence of urolithiasis, hydronephrosis, or other significant abnormality.   ____________________________________________  FINAL ASSESSMENT AND PLAN  Flank pain  Plan: Patient's labs and imaging were dictated above. Patient had presented for flank pain with a normal examination. He may have an internal hemorrhoid but on examination there is no external hemorrhoid or bleeding. He is stable for outpatient follow up.   Earleen Newport, MD   Note: This note was generated in part or whole with voice recognition software. Voice recognition is usually quite accurate but there are transcription errors that can and very often do occur. I apologize for any typographical errors that were not detected and corrected.     Earleen Newport, MD 08/21/16 [phone]

## 2017-07-08 ENCOUNTER — Emergency Department
Admission: EM | Admit: 2017-07-08 | Discharge: 2017-07-08 | Disposition: A | Discharge status: Home / Self Care | Payer: Self-pay | Attending: Emergency Medicine | Admitting: Emergency Medicine

## 2017-07-08 ENCOUNTER — Other Ambulatory Visit: Payer: Self-pay

## 2017-07-08 ENCOUNTER — Encounter: Payer: Self-pay | Admitting: Emergency Medicine

## 2017-07-08 DIAGNOSIS — Z79899 Other long term (current) drug therapy: Secondary | ICD-10-CM | POA: Insufficient documentation

## 2017-07-08 DIAGNOSIS — R05 Cough: Secondary | ICD-10-CM | POA: Insufficient documentation

## 2017-07-08 DIAGNOSIS — J0100 Acute maxillary sinusitis, unspecified: Secondary | ICD-10-CM | POA: Insufficient documentation

## 2017-07-08 DIAGNOSIS — F17290 Nicotine dependence, other tobacco product, uncomplicated: Secondary | ICD-10-CM | POA: Insufficient documentation

## 2017-07-08 MED ORDER — PREDNISONE 10 MG PO TABS: ORAL_TABLET | ORAL | 0 refills | Status: DC

## 2017-07-08 NOTE — ED Provider Notes (Signed)
[facility] Emergency Department Provider Note  ____________________________________________  Time seen: Approximately 12:29 PM  I have reviewed the triage vital signs and the nursing notes.   HISTORY  Chief Complaint Cough    HPI STEFFON GLADU is a 37 y.o. male that presents to the emergency department for evaluation of nasal congestion and ear pressure for 4 days.  He is coughing up postnasal drip but is not coughing up anything from his lungs.  He has not checked his temperature but has felt warm. He has a flareup of sinusitis about once a year.  Usually steroids improve symptoms.  He has been using Afrin several times a day for 4 days.  No shortness of breath, chest pain, nausea, vomiting, abdominal pain   History reviewed. No pertinent past medical history.  There are no active problems to display for this patient.   History reviewed. No pertinent surgical history.  Prior to Admission medications   Medication Sig Start Date End Date Taking? Authorizing Provider  amoxicillin (AMOXIL) 500 MG capsule Take 1 capsule (500 mg total) by mouth 3 (three) times daily. 04/07/16   Menshew, Dannielle Karvonen, PA-C  fluticasone (FLONASE) 50 MCG/ACT nasal spray Place 2 sprays into both nostrils daily. 08/18/15   Hagler, Jami L, PA-C  hydrocortisone (ANUSOL-HC) 25 MG suppository Place 1 suppository (25 mg total) rectally every 12 (twelve) hours. 08/21/16 08/21/17  Earleen Newport, MD  hydrOXYzine (ATARAX/VISTARIL) 25 MG tablet Take 1 tablet (25 mg total) by mouth 3 (three) times daily as needed. 02/06/16   Triplett, Johnette Abraham B, FNP  predniSONE (DELTASONE) 10 MG tablet Take 6 tablets on day 1, take 5 tablets on day 2, take 4 tablets on day 3, take 3 tablets on day 4, take 2 tablets on day 5, take 1 tablet on day 6 07/08/17   Laban Emperor, PA-C    Allergies Patient has no known allergies.  No family history on file.  Social History Social History   Tobacco Use  .  Smoking status: Current Every Day Smoker    Types: E-cigarettes  . Smokeless tobacco: Never Used  Substance Use Topics  . Alcohol use: No  . Drug use: No     Review of Systems  Eyes: No visual changes. No discharge. ENT: Positive for congestion and rhinorrhea. Cardiovascular: No chest pain. Respiratory: Positive for cough. No SOB. Gastrointestinal: No abdominal pain.  No nausea, no vomiting.  No diarrhea.  No constipation. Musculoskeletal: Negative for musculoskeletal pain. Skin: Negative for rash, abrasions, lacerations, ecchymosis. Neurological: Negative for headaches.   ____________________________________________   PHYSICAL EXAM:  VITAL SIGNS: ED Triage Vitals  Enc Vitals Group     BP 07/08/17 1131 118/83     Pulse Rate 07/08/17 1131 70     Resp 07/08/17 1131 (!) 22     Temp 07/08/17 1131 98.2 F (36.8 C)     Temp Source 07/08/17 1131 Oral     SpO2 07/08/17 1131 95 %     Weight 07/08/17 1132 205 lb (93 kg)     Height 07/08/17 1132 6\' 1"  (1.854 m)     Head Circumference --      Peak Flow --      Pain Score 07/08/17 1132 7     Pain Loc --      Pain Edu? --      Excl. in [place]? --      Constitutional: Alert and oriented. Well appearing and in no acute distress. Eyes: Conjunctivae  are normal. PERRL. EOMI. No discharge. Head: Atraumatic. ENT: Maxillary sinus tenderness.      Ears: Tympanic membranes pearly gray with good landmarks. No discharge.      Nose: Mild congestion/rhinnorhea.      Mouth/Throat: Mucous membranes are moist. Oropharynx non-erythematous. Tonsils not enlarged. No exudates. Uvula midline. Neck: No stridor.   Hematological/Lymphatic/Immunilogical: No cervical lymphadenopathy. Cardiovascular: Normal rate, regular rhythm.  Good peripheral circulation. Respiratory: Normal respiratory effort without tachypnea or retractions. Lungs CTAB. Good air entry to the bases with no decreased or absent breath sounds. Gastrointestinal: Bowel sounds 4 quadrants.  Soft and nontender to palpation. No guarding or rigidity. No palpable masses. No distention. Musculoskeletal: Full range of motion to all extremities. No gross deformities appreciated. Neurologic:  Normal speech and language. No gross focal neurologic deficits are appreciated.  Skin:  Skin is warm, dry and intact. No rash noted.    ____________________________________________   LABS (all labs ordered are listed, but only abnormal results are displayed)  Labs Reviewed - No data to display ____________________________________________  EKG   ____________________________________________  RADIOLOGY   No results found.  ____________________________________________    PROCEDURES  Procedure(s) performed:    Procedures    Medications - No data to display   ____________________________________________   INITIAL IMPRESSION / ASSESSMENT AND PLAN / ED COURSE  Pertinent labs & imaging results that were available during my care of the patient were reviewed by me and considered in my medical decision making (see chart for details).  Review of the [place] CSRS was performed in accordance of the [place] prior to dispensing any controlled drugs.   Patient's diagnosis is consistent with sinus infection. Vital signs and exam are reassuring.  This is likely viral.  Patient appears well and is staying well hydrated. Patient should alternate tylenol and ibuprofen for fever.  Patient was educated to discontinue Afrin.  Patient feels comfortable going home. Patient will be discharged home with prescriptions for prednisone. Patient is to follow up with his PCP as needed or otherwise directed. Patient is given ED precautions to return to the ED for any worsening or new symptoms.     ____________________________________________  FINAL CLINICAL IMPRESSION(S) / ED DIAGNOSES  Final diagnoses:  Acute maxillary sinusitis, recurrence not specified      NEW MEDICATIONS STARTED DURING THIS  VISIT:  ED Discharge Orders        Ordered    predniSONE (DELTASONE) 10 MG tablet     07/08/17 1318          This chart was dictated using voice recognition software/Dragon. Despite best efforts to proofread, errors can occur which can change the meaning. Any change was purely unintentional.    Laban Emperor, PA-C 07/08/17 1330    Eula Listen, MD 07/08/17 [phone]

## 2017-07-08 NOTE — ED Triage Notes (Signed)
Presents with cough,right ear pain and swollen glands  Subjective fever  States cough is occasionally prod

## 2017-07-19 ENCOUNTER — Emergency Department
Admission: EM | Admit: 2017-07-19 | Discharge: 2017-07-19 | Disposition: A | Discharge status: Home / Self Care | Payer: Self-pay | Attending: Emergency Medicine | Admitting: Emergency Medicine

## 2017-07-19 ENCOUNTER — Encounter: Payer: Self-pay | Admitting: Emergency Medicine

## 2017-07-19 ENCOUNTER — Other Ambulatory Visit: Payer: Self-pay

## 2017-07-19 DIAGNOSIS — Y999 Unspecified external cause status: Secondary | ICD-10-CM | POA: Insufficient documentation

## 2017-07-19 DIAGNOSIS — S60455A Superficial foreign body of left ring finger, initial encounter: Secondary | ICD-10-CM | POA: Insufficient documentation

## 2017-07-19 DIAGNOSIS — W458XXA Other foreign body or object entering through skin, initial encounter: Secondary | ICD-10-CM | POA: Insufficient documentation

## 2017-07-19 DIAGNOSIS — Y929 Unspecified place or not applicable: Secondary | ICD-10-CM | POA: Insufficient documentation

## 2017-07-19 DIAGNOSIS — Z5321 Procedure and treatment not carried out due to patient leaving prior to being seen by health care provider: Secondary | ICD-10-CM | POA: Insufficient documentation

## 2017-07-19 DIAGNOSIS — Y939 Activity, unspecified: Secondary | ICD-10-CM | POA: Insufficient documentation

## 2017-07-19 NOTE — ED Notes (Signed)
Pt to desk stating he cannot wait any longer and that he will return if needed. Pt encouraged to stay and be seen but did not wish to wai any longer.

## 2017-07-19 NOTE — ED Triage Notes (Signed)
Pt presents with wooden splinter under nail of 3rd digit on left hand; pt says it's been there for 2-3 days and he has been unable to remove it; no redness/swelling noted

## 2018-05-12 ENCOUNTER — Emergency Department
Admission: EM | Admit: 2018-05-12 | Discharge: 2018-05-12 | Disposition: A | Discharge status: Home / Self Care | Payer: Self-pay | Attending: Emergency Medicine | Admitting: Emergency Medicine

## 2018-05-12 ENCOUNTER — Emergency Department: Payer: Self-pay

## 2018-05-12 ENCOUNTER — Other Ambulatory Visit: Payer: Self-pay

## 2018-05-12 DIAGNOSIS — F17290 Nicotine dependence, other tobacco product, uncomplicated: Secondary | ICD-10-CM | POA: Insufficient documentation

## 2018-05-12 DIAGNOSIS — K625 Hemorrhage of anus and rectum: Secondary | ICD-10-CM | POA: Insufficient documentation

## 2018-05-12 LAB — COMPREHENSIVE METABOLIC PANEL
ALBUMIN: 4.4 g/dL (ref 3.5–5.0)
ALK PHOS: 83 U/L (ref 38–126)
ALT: 33 U/L (ref 0–44)
ANION GAP: 9 (ref 5–15)
AST: 21 U/L (ref 15–41)
BUN: 11 mg/dL (ref 6–20)
CALCIUM: 9.7 mg/dL (ref 8.9–10.3)
CO2: 23 mmol/L (ref 22–32)
Chloride: 107 mmol/L (ref 98–111)
Creatinine, Ser: 1.01 mg/dL (ref 0.61–1.24)
GFR calc Af Amer: >60 mL/min (ref >60)
GFR calc non Af Amer: >60 mL/min (ref >60)
GLUCOSE: 93 mg/dL (ref 70–99)
Potassium: 4.6 mmol/L (ref 3.5–5.1)
SODIUM: 139 mmol/L (ref 135–145)
Total Bilirubin: 0.8 mg/dL (ref 0.3–1.2)
Total Protein: 7.1 g/dL (ref 6.5–8.1)

## 2018-05-12 LAB — CBC
HCT: 46.7 % (ref 39.0–52.0)
Hemoglobin: 15.6 g/dL (ref 13.0–17.0)
MCH: 28.6 pg (ref 26.0–34.0)
MCHC: 33.4 g/dL (ref 30.0–36.0)
MCV: 85.5 fL (ref 80.0–100.0)
PLATELETS: 193 10*3/uL (ref 150–400)
RBC: 5.46 MIL/uL (ref 4.22–5.81)
RDW: 13.4 % (ref 11.5–15.5)
WBC: 12 10*3/uL — ABNORMAL HIGH (ref 4.0–10.5)
nRBC: 0 % (ref 0.0–0.2)

## 2018-05-12 MED ORDER — HYDROCORTISONE ACETATE 25 MG RE SUPP: 25.0000 mg | Freq: Two times a day (BID) | RECTAL | 1 refills | Status: AC

## 2018-05-12 NOTE — ED Provider Notes (Signed)
[facility] Emergency Department Provider Note       Time seen: ----------------------------------------- 5:36 PM on 05/12/2018 -----------------------------------------   I have reviewed the triage vital signs and the nursing notes.  HISTORY   Chief Complaint Abdominal Pain and GI Bleeding    HPI Erik Francis is a 37 y.o. male with no significant past medical history who presents to the ED for lower abdominal pain for the past 2 days.  He is also passed some bright red blood per rectum approximately 1 hour ago.  He denies fevers, chills, vomiting or diarrhea.  He denies a history of this before.  Nothing makes it better.  History reviewed. No pertinent past medical history.  There are no active problems to display for this patient.   History reviewed. No pertinent surgical history.  Allergies Patient has no known allergies.  Social History Social History   Tobacco Use  . Smoking status: Current Every Day Smoker    Types: E-cigarettes  . Smokeless tobacco: Never Used  Substance Use Topics  . Alcohol use: No  . Drug use: No   Review of Systems Constitutional: Negative for fever. Cardiovascular: Negative for chest pain. Respiratory: Negative for shortness of breath. Gastrointestinal: Positive for abdominal pain, rectal bleeding Musculoskeletal: Negative for back pain. Skin: Negative for rash. Neurological: Negative for headaches, focal weakness or numbness.  All systems negative/normal/unremarkable except as stated in the HPI  ____________________________________________   PHYSICAL EXAM:  VITAL SIGNS: ED Triage Vitals [05/12/18 1517]  Enc Vitals Group     BP (!) 136/113     Pulse Rate 90     Resp 18     Temp 98.3 F (36.8 C)     Temp Source Oral     SpO2 98 %     Weight 205 lb (93 kg)     Height 6' (1.829 m)     Head Circumference      Peak Flow      Pain Score 7     Pain Loc      Pain Edu?      Excl. in [place]?     Constitutional: Alert and oriented. Well appearing and in no distress. Cardiovascular: Normal rate, regular rhythm. No murmurs, rubs, or gallops. Respiratory: Normal respiratory effort without tachypnea nor retractions. Breath sounds are clear and equal bilaterally. No wheezes/rales/rhonchi. Gastrointestinal: Soft and nontender. Normal bowel sounds Rectal: No blood is visualized, no obvious hemorrhoids. Musculoskeletal: Nontender with normal range of motion in extremities. No lower extremity tenderness nor edema. Neurologic:  Normal speech and language. No gross focal neurologic deficits are appreciated.  Skin:  Skin is warm, dry and intact. No rash noted. Psychiatric: Mood and affect are normal. Speech and behavior are normal.  ____________________________________________  ED COURSE:  As part of my medical decision making, I reviewed the following data within the [place] History obtained from family if available, nursing notes, old chart and ekg, as well as notes from prior ED visits. Patient presented for rectal bleeding, we will assess with labs and imaging as indicated at this time.   Procedures ____________________________________________   LABS (pertinent positives/negatives)  Labs Reviewed  CBC - Abnormal; Notable for the following components:      Result Value   WBC 12.0 (*)    All other components within normal limits  COMPREHENSIVE METABOLIC PANEL  POC OCCULT BLOOD, ED  ____________________________________________  DIFFERENTIAL DIAGNOSIS   Hemorrhoidal bleeding, anal fissure, constipation  FINAL ASSESSMENT AND PLAN  Rectal bleeding   Plan: The patient had presented for rectal bleeding. Patient's labs are reassuring. Patient's imaging was unremarkable.  We will try Anusol HC suppositories and referred to GI for outpatient follow-up.   Laurence Aly, MD   Note: This note was generated in part or whole with voice recognition  software. Voice recognition is usually quite accurate but there are transcription errors that can and very often do occur. I apologize for any typographical errors that were not detected and corrected.     Earleen Newport, MD 05/12/18 [phone]

## 2018-05-12 NOTE — ED Triage Notes (Signed)
Pt c/o abdominal pain X 2 days, states bright red blood BM approx 1 hour ago.   Denies NVD. Pt alert and oriented X4, active, cooperative, pt in NAD. RR even and unlabored, color WNL.

## 2018-05-12 NOTE — ED Triage Notes (Signed)
First Nurse Note:  C/O lower abdominal cramping and rectal bleeding.  Patient is anxious.  Skin warm and dry. NAD

## 2018-05-12 NOTE — ED Notes (Signed)
AAOx3.  Skin warm and dry.  NAD 

## 2018-05-19 ENCOUNTER — Encounter: Payer: Self-pay | Admitting: Gastroenterology

## 2018-05-19 ENCOUNTER — Ambulatory Visit: Payer: Self-pay | Admitting: Gastroenterology

## 2018-06-23 ENCOUNTER — Ambulatory Visit: Payer: Self-pay | Admitting: Gastroenterology

## 2018-10-29 ENCOUNTER — Telehealth: Payer: Self-pay

## 2018-10-29 DIAGNOSIS — Z20822 Contact with and (suspected) exposure to covid-19: Secondary | ICD-10-CM

## 2018-10-29 NOTE — Telephone Encounter (Signed)
Contacted by [facility] Department. Pt needs COVID-19 testing  [facility] Department Office [phone]  Call placed to patient. Appointment scheduled and order placed.

## 2018-10-30 ENCOUNTER — Other Ambulatory Visit: Payer: Self-pay

## 2018-10-30 DIAGNOSIS — Z20822 Contact with and (suspected) exposure to covid-19: Secondary | ICD-10-CM

## 2018-11-01 LAB — NOVEL CORONAVIRUS, NAA: SARS-CoV-2, NAA: NOT DETECTED

## 2019-03-13 IMAGING — CT CT RENAL STONE PROTOCOL
2 of 4 series · 17 of 46 positions shown, 19 images · non-contrast
Comparison: 12/11/2012

CLINICAL DATA: Left flank pain for 1 week.  Hematuria.

EXAM:
CT ABDOMEN AND PELVIS WITHOUT CONTRAST
TECHNIQUE: Multidetector CT imaging of the abdomen and pelvis was performed
following the standard protocol without IV contrast.

[Series 2: stone full standard · axial · 0.83mm/px · z∈[-1105,-635]mm · 14 of 104 slices shown, 16 images]
[im 5/104  soft-tissue]
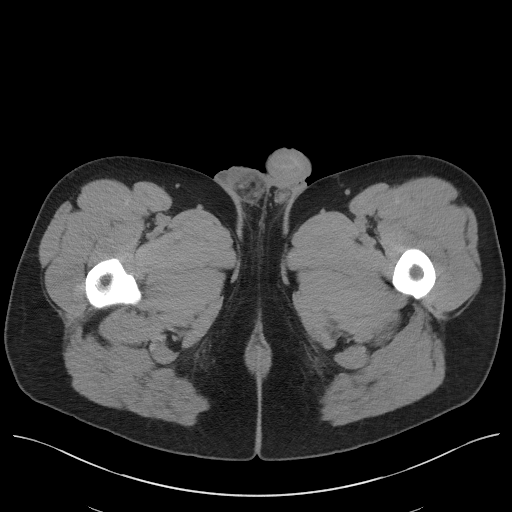
[im 5/104  bone]
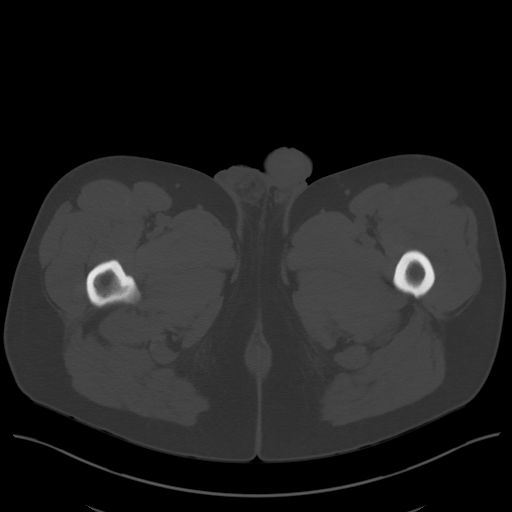
[im 13/104  soft-tissue]
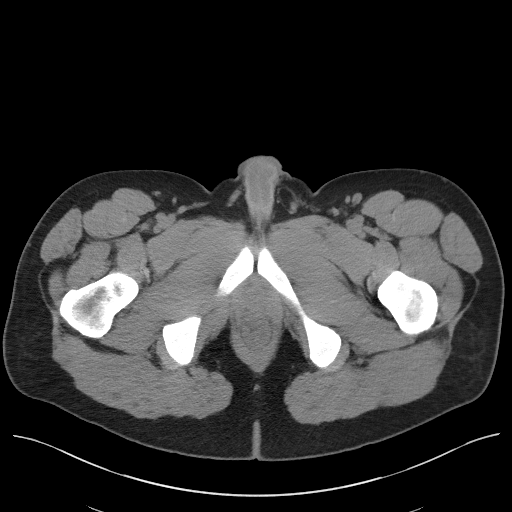
[im 21/104  soft-tissue]
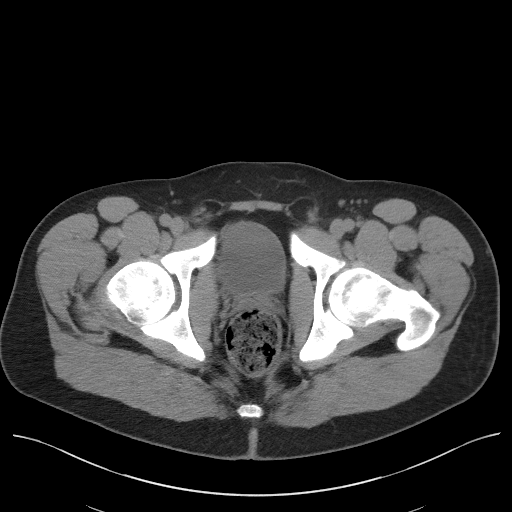
[im 29/104  soft-tissue]
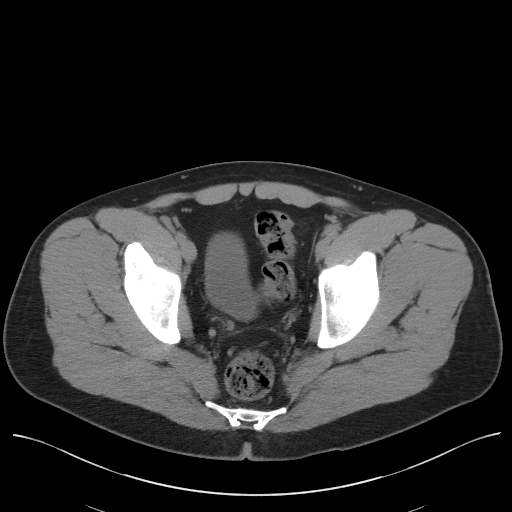
[im 33/104  soft-tissue]
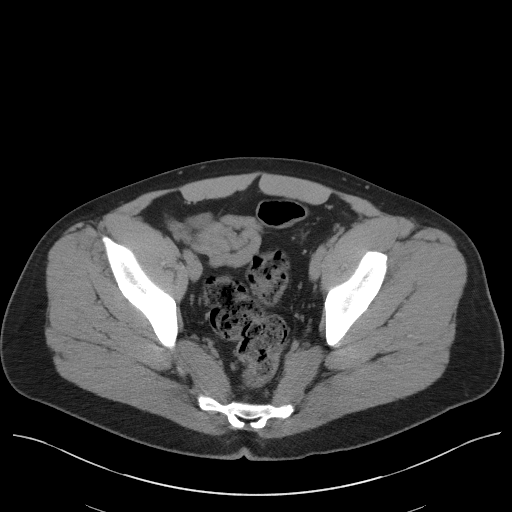
[im 42/104  soft-tissue]
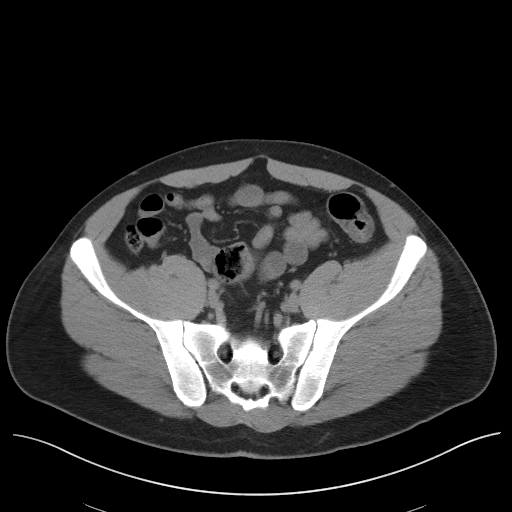
[im 50/104  soft-tissue]
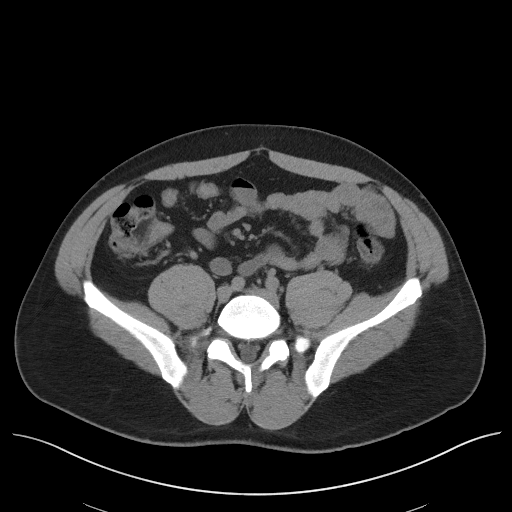
[im 54/104  soft-tissue]
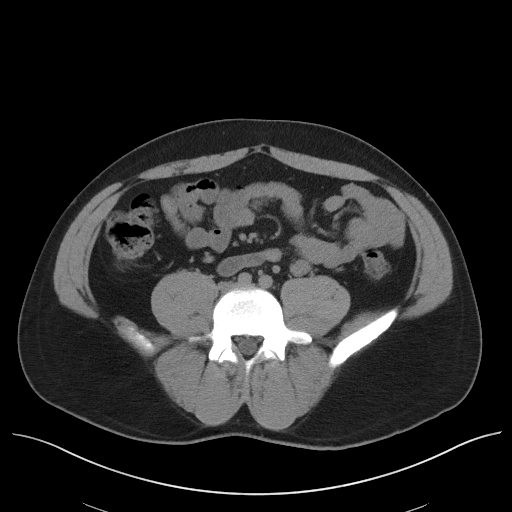
[im 62/104  soft-tissue]
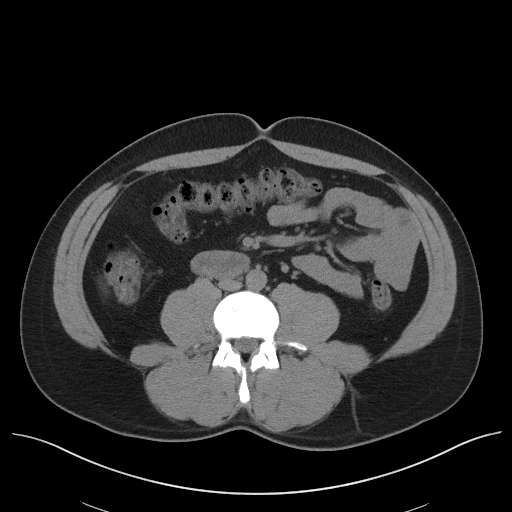
[im 62/104  bone]
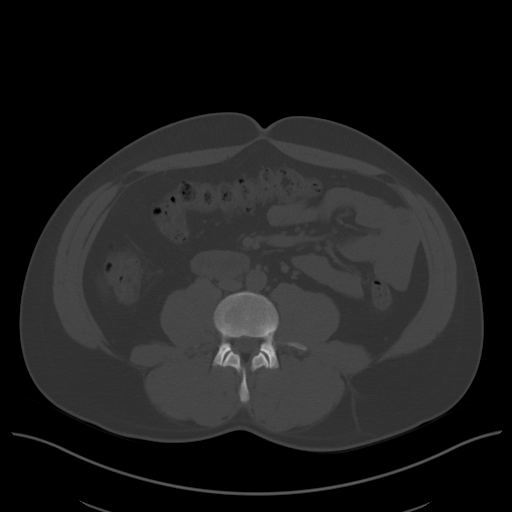
[im 71/104  soft-tissue]
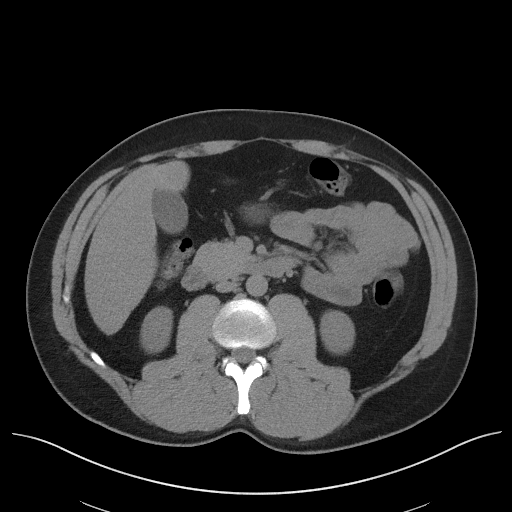
[im 79/104  soft-tissue]
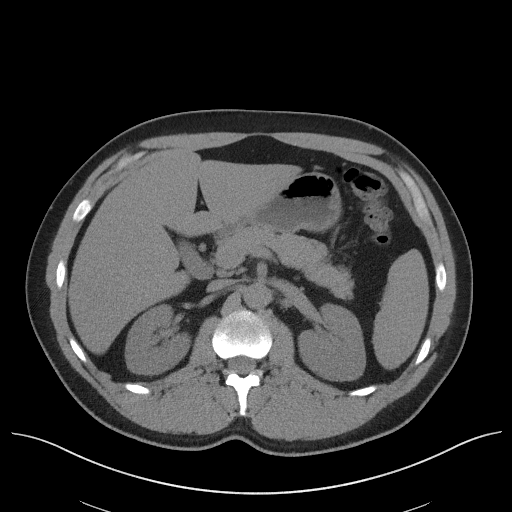
[im 83/104  soft-tissue]
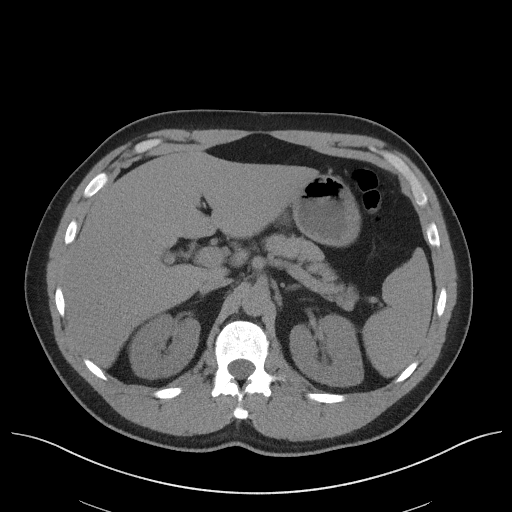
[im 91/104  soft-tissue]
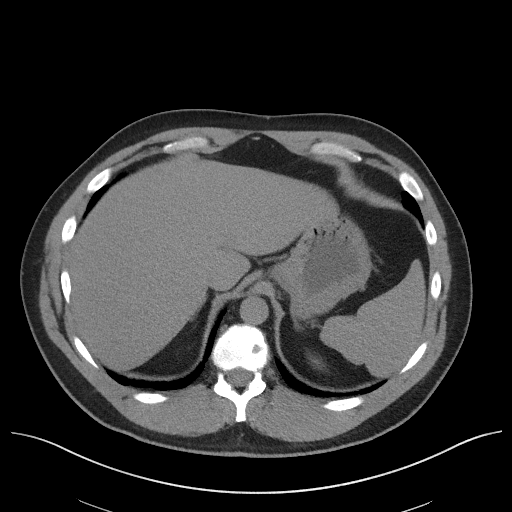
[im 99/104  soft-tissue]
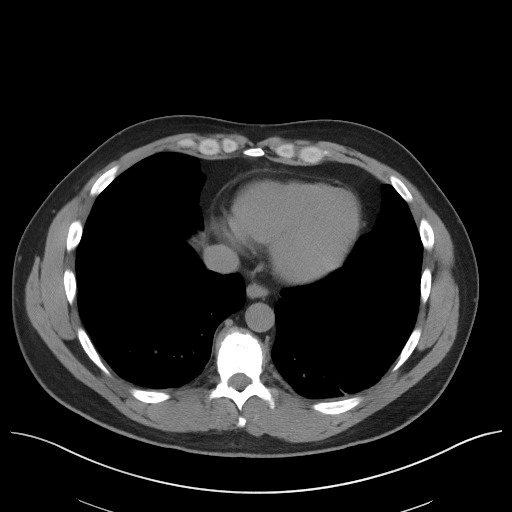

[Series 5: coronal · coronal · 0.87mm/px · 3 of 137 slices shown]
[im 46/137  soft-tissue]
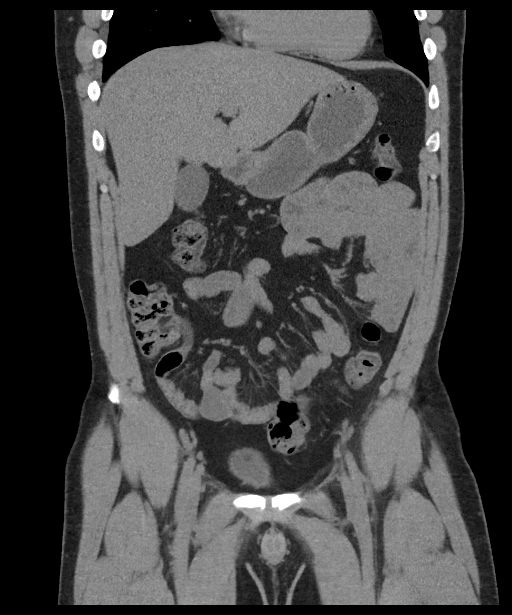
[im 61/137  soft-tissue]
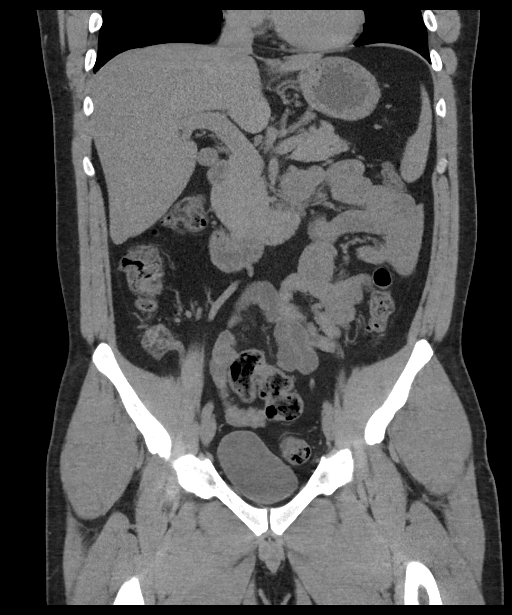
[im 76/137  soft-tissue]
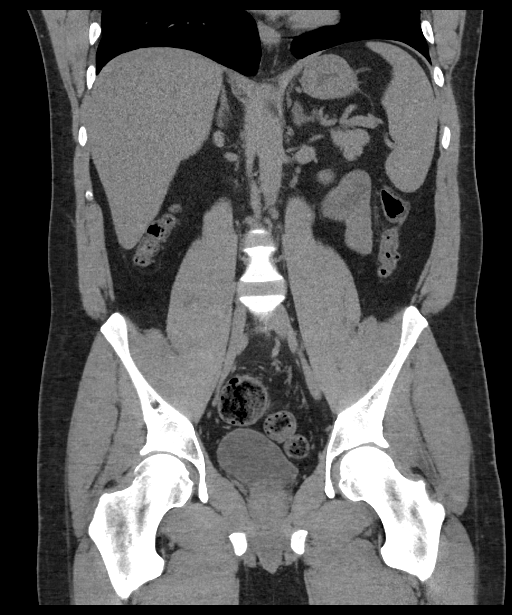

[17 of 46 positions shown; findings below may reference images not displayed]

FINDINGS: Lower chest: No acute findings.  Stable bibasilar scarring.

Hepatobiliary: No masses visualized on this unenhanced exam.
Gallbladder is unremarkable.

Pancreas: No mass or inflammatory process visualized on this
unenhanced exam.

Spleen:  Within normal limits in size.

Adrenals/Urinary tract: No evidence of urolithiasis or
hydronephrosis. Unremarkable unopacified urinary bladder.

Stomach/Bowel: No evidence of obstruction, inflammatory process, or
abnormal fluid collections. Normal appendix visualized.

Vascular/Lymphatic: No pathologically enlarged lymph nodes
identified. No evidence of abdominal aortic aneurysm.

Reproductive:  No mass or other significant abnormality.

Other:  None.

Musculoskeletal:  No suspicious bone lesions identified.
IMPRESSION: No evidence of urolithiasis, hydronephrosis, or other significant
abnormality.

## 2019-05-26 ENCOUNTER — Encounter: Payer: Self-pay | Admitting: Emergency Medicine

## 2019-05-26 ENCOUNTER — Emergency Department: Payer: Self-pay

## 2019-05-26 ENCOUNTER — Other Ambulatory Visit: Payer: Self-pay

## 2019-05-26 ENCOUNTER — Emergency Department
Admission: EM | Admit: 2019-05-26 | Discharge: 2019-05-26 | Disposition: A | Discharge status: Home / Self Care | Payer: Self-pay | Attending: Emergency Medicine | Admitting: Emergency Medicine

## 2019-05-26 DIAGNOSIS — F17290 Nicotine dependence, other tobacco product, uncomplicated: Secondary | ICD-10-CM | POA: Insufficient documentation

## 2019-05-26 DIAGNOSIS — Z20822 Contact with and (suspected) exposure to covid-19: Secondary | ICD-10-CM | POA: Insufficient documentation

## 2019-05-26 DIAGNOSIS — J069 Acute upper respiratory infection, unspecified: Secondary | ICD-10-CM | POA: Insufficient documentation

## 2019-05-26 LAB — SARS CORONAVIRUS 2 (TAT 6-24 HRS): SARS Coronavirus 2: NEGATIVE

## 2019-05-26 MED ORDER — PREDNISONE 10 MG PO TABS: ORAL_TABLET | ORAL | 0 refills | Status: DC

## 2019-05-26 MED ORDER — AZITHROMYCIN 250 MG PO TABS: ORAL_TABLET | ORAL | 0 refills | Status: DC

## 2019-05-26 NOTE — ED Notes (Signed)
AAOx3.  Skin warm and dry.  Sinus congestion noted.  Patient states he has history of sinus problems and typically has one sinus infection a year.  States he has been taking OTC zyrtec medication (generic) for the past two years and he sinus problems have been much better.  States he has been unable to get the OTC medication x 3 weeks.

## 2019-05-26 NOTE — Discharge Instructions (Addendum)
Follow-up with your primary care provider if any continued problems.  Also if you do not have a primary care provider a list of clinics are listed on your discharge papers.  These clinics are on a sliding scale and charge accordingly except for the [facility] which is for a.  You should increase fluids and take Tylenol if needed.  A prescription for Zithromax and prednisone was sent to your pharmacy.  You will need to quarantine until you have seen the results of your Covid test.  If this test is positive you will need to quarantine an additional 10 days.

## 2019-05-26 NOTE — ED Provider Notes (Signed)
[facility] Emergency Department Provider Note  ____________________________________________   First MD Initiated Contact with Patient 05/26/19 1104     (approximate)  I have reviewed the triage vital signs and the nursing notes.   HISTORY  Chief Complaint Cough, Fever, Facial Pain, and Nasal Congestion   HPI Erik Francis is a 39 y.o. male presents to the ED with complaint of sinus congestion, productive cough for the last week he also reports fever, nasal congestion and pain.  He states that nothing tastes right.  He denies any nausea, vomiting or diarrhea.  He states that initially he did have some fever which is resolved now.  He denies any shortness of breath or difficulty breathing except for the cough.  He has been taking over-the-counter allergy medicine with little relief.  Rates his pain as 7 out of 10.      History reviewed. No pertinent past medical history.  There are no problems to display for this patient.   History reviewed. No pertinent surgical history.  Prior to Admission medications   Medication Sig Start Date End Date Taking? Authorizing Provider  azithromycin (ZITHROMAX Z-PAK) 250 MG tablet Take 2 tablets (500 mg) on  Day 1,  followed by 1 tablet (250 mg) once daily on Days 2 through 5. 05/26/19   Johnn Hai, PA-C  predniSONE (DELTASONE) 10 MG tablet Take 6 tablets  today, on day 2 take 5 tablets, day 3 take 4 tablets, day 4 take 3 tablets, day 5 take  2 tablets and 1 tablet the last day 05/26/19   Johnn Hai, PA-C  fluticasone (FLONASE) 50 MCG/ACT nasal spray Place 2 sprays into both nostrils daily. 08/18/15 05/26/19  Hagler, Jami L, PA-C    Allergies Patient has no known allergies.  No family history on file.  Social History Social History   Tobacco Use  . Smoking status: Current Every Day Smoker    Types: E-cigarettes  . Smokeless tobacco: Never Used  Substance Use Topics  . Alcohol use: No  . Drug use: No     Review of Systems Constitutional: No fever/chills Eyes: No visual changes. ENT: No sore throat.  Positive nasal congestion. Cardiovascular: Denies chest pain. Respiratory: Denies shortness of breath.  Positive productive cough. Gastrointestinal: No abdominal pain.  No nausea, no vomiting.  No diarrhea. Genitourinary: Negative for dysuria. Musculoskeletal: Negative for back pain. Skin: Negative for rash. Neurological: Negative for headaches, focal weakness or numbness. ___________________________________________   PHYSICAL EXAM:  VITAL SIGNS: ED Triage Vitals [05/26/19 1037]  Enc Vitals Group     BP      Pulse      Resp      Temp      Temp src      SpO2      Weight 215 lb (97.5 kg)     Height 6' (1.829 m)     Head Circumference      Peak Flow      Pain Score 7     Pain Loc      Pain Edu?      Excl. in [place]?    Constitutional: Alert and oriented. Well appearing and in no acute distress. Eyes: Conjunctivae are normal. PERRL. EOMI. Head: Atraumatic. Nose: No congestion/rhinnorhea. Mouth/Throat: Mucous membranes are moist.  Oropharynx non-erythematous. Neck: No stridor.   Cardiovascular: Normal rate, regular rhythm. Grossly normal heart sounds.  Good peripheral circulation. Respiratory: Normal respiratory effort.  No retractions. Lungs CTAB. Musculoskeletal: Moves upper and  lower extremities any difficulty normal gait was noted without assistance. Neurologic:  Normal speech and language. No gross focal neurologic deficits are appreciated.  Skin:  Skin is warm, dry and intact. No rash noted. Psychiatric: Mood and affect are normal. Speech and behavior are normal.  ____________________________________________   LABS (all labs ordered are listed, but only abnormal results are displayed)  Labs Reviewed  SARS CORONAVIRUS 2 (TAT 6-24 HRS)    RADIOLOGY  Official radiology report(s): DG Chest 2 View  Result Date: 05/26/2019 CLINICAL DATA:  Cough and chest pain EXAM:  CHEST - 2 VIEW COMPARISON:  March 13, 2014 FINDINGS: There is blunting of the right costophrenic angle. Lungs elsewhere are clear. Heart size and pulmonary vascularity are normal. No adenopathy. There is mild degenerative change in the thoracic spine. No pneumothorax. IMPRESSION: Blunting of right costophrenic angle. Suspect scarring, although small superimposed pleural effusion cannot be excluded in this area. The lungs elsewhere are clear. The cardiac silhouette is normal. No adenopathy. No pneumothorax. Electronically Signed   By: Lowella Grip III M.D.   On: 05/26/2019 11:01    ____________________________________________   PROCEDURES  Procedure(s) performed (including Critical Care):  Procedures ____________________________________________   INITIAL IMPRESSION / ASSESSMENT AND PLAN / ED COURSE  As part of my medical decision making, I reviewed the following data within the electronic MEDICAL RECORD NUMBER Notes from prior ED visits and  Controlled Substance Database  39 year old male presents to the ED with complaint of productive cough, fever and nasal congestion.  Patient states initially 1/2 weeks ago when his symptoms first started he had a fever which has resolved.  Patient has been using over-the-counter medication without relief.  He has not been exposed to Covid to his knowledge.  A Covid test was done and patient was made aware that the results will be back tomorrow.  He is aware that he should quarantine until those root results have been received.  Patient was placed on Zithromax and prednisone should this all be sinus related.  He was given a note for work letting them know that he was tested for Covid and if test is positive he will need additional days out.  ____________________________________________   FINAL CLINICAL IMPRESSION(S) / ED DIAGNOSES  Final diagnoses:  Upper respiratory tract infection, unspecified type     ED Discharge Orders         Ordered     azithromycin (ZITHROMAX Z-PAK) 250 MG tablet     05/26/19 1144    predniSONE (DELTASONE) 10 MG tablet     05/26/19 1144           Note:  This document was prepared using Dragon voice recognition software and may include unintentional dictation errors.    Johnn Hai, PA-C 05/26/19 1354    Blake Divine, MD 05/27/19 1558

## 2019-05-26 NOTE — ED Triage Notes (Signed)
Pt reports productive cough for the past week and a half with green sputum. Pt also reports fevers, nasal drainage and sinus pressure and pain.

## 2019-06-04 ENCOUNTER — Other Ambulatory Visit: Payer: Self-pay | Admitting: Family Medicine

## 2019-06-04 DIAGNOSIS — Z8611 Personal history of tuberculosis: Secondary | ICD-10-CM

## 2019-06-15 ENCOUNTER — Ambulatory Visit
Admission: RE | Admit: 2019-06-15 | Discharge: 2019-06-15 | Disposition: A | Discharge status: Home / Self Care | Payer: Self-pay | Source: Ambulatory Visit | Attending: Family Medicine | Admitting: Family Medicine

## 2019-06-15 ENCOUNTER — Other Ambulatory Visit: Payer: Self-pay

## 2019-06-15 DIAGNOSIS — Z8611 Personal history of tuberculosis: Secondary | ICD-10-CM | POA: Insufficient documentation

## 2019-12-09 ENCOUNTER — Emergency Department
Admission: EM | Admit: 2019-12-09 | Discharge: 2019-12-09 | Disposition: A | Discharge status: Home / Self Care | Payer: Self-pay | Attending: Emergency Medicine | Admitting: Emergency Medicine

## 2019-12-09 ENCOUNTER — Encounter: Payer: Self-pay | Admitting: Emergency Medicine

## 2019-12-09 ENCOUNTER — Other Ambulatory Visit: Payer: Self-pay

## 2019-12-09 DIAGNOSIS — L255 Unspecified contact dermatitis due to plants, except food: Secondary | ICD-10-CM | POA: Insufficient documentation

## 2019-12-09 DIAGNOSIS — Z5321 Procedure and treatment not carried out due to patient leaving prior to being seen by health care provider: Secondary | ICD-10-CM | POA: Insufficient documentation

## 2019-12-09 NOTE — ED Triage Notes (Signed)
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

## 2019-12-09 NOTE — ED Notes (Signed)
Pt states that he is leaving.  

## 2020-04-17 ENCOUNTER — Other Ambulatory Visit: Payer: Self-pay

## 2020-04-17 ENCOUNTER — Emergency Department
Admission: EM | Admit: 2020-04-17 | Discharge: 2020-04-17 | Disposition: A | Discharge status: Home / Self Care | Payer: Self-pay | Attending: Emergency Medicine | Admitting: Emergency Medicine

## 2020-04-17 ENCOUNTER — Encounter: Payer: Self-pay | Admitting: Emergency Medicine

## 2020-04-17 ENCOUNTER — Emergency Department: Payer: Self-pay

## 2020-04-17 DIAGNOSIS — R109 Unspecified abdominal pain: Secondary | ICD-10-CM | POA: Insufficient documentation

## 2020-04-17 DIAGNOSIS — F17290 Nicotine dependence, other tobacco product, uncomplicated: Secondary | ICD-10-CM | POA: Insufficient documentation

## 2020-04-17 DIAGNOSIS — Z23 Encounter for immunization: Secondary | ICD-10-CM | POA: Insufficient documentation

## 2020-04-17 DIAGNOSIS — M545 Low back pain, unspecified: Secondary | ICD-10-CM

## 2020-04-17 LAB — URINALYSIS, COMPLETE (UACMP) WITH MICROSCOPIC
Bacteria, UA: NONE SEEN
Bilirubin Urine: NEGATIVE
Glucose, UA: NEGATIVE mg/dL
Hgb urine dipstick: NEGATIVE
Ketones, ur: NEGATIVE mg/dL
Leukocytes,Ua: NEGATIVE
Nitrite: NEGATIVE
Protein, ur: NEGATIVE mg/dL
Specific Gravity, Urine: 1.019 (ref 1.005–1.030)
Squamous Epithelial / HPF: NONE SEEN (ref 0–5)
WBC, UA: NONE SEEN WBC/hpf (ref 0–5)
pH: 5 (ref 5.0–8.0)

## 2020-04-17 LAB — COMPREHENSIVE METABOLIC PANEL
ALT: 24 U/L (ref 0–44)
AST: 21 U/L (ref 15–41)
Albumin: 4.2 g/dL (ref 3.5–5.0)
Alkaline Phosphatase: 66 U/L (ref 38–126)
Anion gap: 9 (ref 5–15)
BUN: 10 mg/dL (ref 6–20)
CO2: 23 mmol/L (ref 22–32)
Calcium: 9.3 mg/dL (ref 8.9–10.3)
Chloride: 105 mmol/L (ref 98–111)
Creatinine, Ser: 1.04 mg/dL (ref 0.61–1.24)
GFR, Estimated: >60 mL/min (ref >60)
Glucose, Bld: 91 mg/dL (ref 70–99)
Potassium: 4.1 mmol/L (ref 3.5–5.1)
Sodium: 137 mmol/L (ref 135–145)
Total Bilirubin: 0.7 mg/dL (ref 0.3–1.2)
Total Protein: 6.9 g/dL (ref 6.5–8.1)

## 2020-04-17 LAB — CBC
HCT: 47.4 % (ref 39.0–52.0)
Hemoglobin: 16 g/dL (ref 13.0–17.0)
MCH: 28.5 pg (ref 26.0–34.0)
MCHC: 33.8 g/dL (ref 30.0–36.0)
MCV: 84.5 fL (ref 80.0–100.0)
Platelets: 181 10*3/uL (ref 150–400)
RBC: 5.61 MIL/uL (ref 4.22–5.81)
RDW: 13.2 % (ref 11.5–15.5)
WBC: 9.2 10*3/uL (ref 4.0–10.5)
nRBC: 0 % (ref 0.0–0.2)

## 2020-04-17 LAB — LIPASE, BLOOD: Lipase: 25 U/L (ref 11–51)

## 2020-04-17 MED ORDER — KETOROLAC TROMETHAMINE 10 MG PO TABS: 10.0000 mg | ORAL_TABLET | Freq: Four times a day (QID) | ORAL | 0 refills | Status: DC | PRN

## 2020-04-17 MED ORDER — KETOROLAC TROMETHAMINE 60 MG/2ML IM SOLN
60.0000 mg | Freq: Once | INTRAMUSCULAR | Status: AC
  Administered 2020-04-17: 60 mg via INTRAMUSCULAR
  Filled 2020-04-17: qty 2

## 2020-04-17 MED ORDER — TETANUS-DIPHTH-ACELL PERTUSSIS 5-2.5-18.5 LF-MCG/0.5 IM SUSY
0.5000 mL | PREFILLED_SYRINGE | Freq: Once | INTRAMUSCULAR | Status: AC
  Administered 2020-04-17: 0.5 mL via INTRAMUSCULAR
  Filled 2020-04-17: qty 0.5

## 2020-04-17 NOTE — ED Triage Notes (Signed)
Pt reports yesterday he started suddenly with sharp pain to his left lower back that radiates into his abd. Pt reports pain in his abd is all over his abd and sharp in nature. Pt denies fevers, or other sx's.

## 2020-04-17 NOTE — ED Notes (Signed)
This RN agrees with triage assessment. This RN has nothing else to add

## 2020-04-17 NOTE — ED Provider Notes (Signed)
[facility] Emergency Department Provider Note  Time seen: 12:32 PM  I have reviewed the triage vital signs and the nursing notes.   HISTORY  Chief Complaint Back Pain and Abdominal Pain   HPI Erik Francis is a 40 y.o. male with no significant past medical history presents to the emergency department for left flank pain.  According to the patient since early this morning he was awoken by pain in his left back that radiates down to his left lower quadrant.  Says his urine was dark this morning but denies any dysuria.  Denies any trauma or any known exacerbating factors.  Denies any significant worsening with movement.  No history of kidney stones or family history that he is aware of.  Describes his pain as sharp 8/10 pain.   History reviewed. No pertinent past medical history.  There are no problems to display for this patient.   History reviewed. No pertinent surgical history.  Prior to Admission medications   Medication Sig Start Date End Date Taking? Authorizing Provider  azithromycin (ZITHROMAX Z-PAK) 250 MG tablet Take 2 tablets (500 mg) on  Day 1,  followed by 1 tablet (250 mg) once daily on Days 2 through 5. 05/26/19   Johnn Hai, PA-C  predniSONE (DELTASONE) 10 MG tablet Take 6 tablets  today, on day 2 take 5 tablets, day 3 take 4 tablets, day 4 take 3 tablets, day 5 take  2 tablets and 1 tablet the last day 05/26/19   Johnn Hai, PA-C  fluticasone (FLONASE) 50 MCG/ACT nasal spray Place 2 sprays into both nostrils daily. 08/18/15 05/26/19  Hagler, Jami L, PA-C    No Known Allergies  No family history on file.  Social History Social History   Tobacco Use  . Smoking status: Current Every Day Smoker    Types: E-cigarettes  . Smokeless tobacco: Never Used  Substance Use Topics  . Alcohol use: No  . Drug use: No    Review of Systems Constitutional: Negative for fever Cardiovascular: Negative for chest pain. Respiratory: Negative for  shortness of breath. Gastrointestinal: 8/10 left flank pain, sharp.  Negative for nausea vomiting or diarrhea Genitourinary: Dark urine this morning without dysuria Musculoskeletal: Negative for musculoskeletal complaints Neurological: Negative for headache All other ROS negative  ____________________________________________   PHYSICAL EXAM:  VITAL SIGNS: ED Triage Vitals  Enc Vitals Group     BP 04/17/20 0941 (!) 144/92     Pulse Rate 04/17/20 0941 83     Resp 04/17/20 0941 20     Temp 04/17/20 0941 98.7 F (37.1 C)     Temp Source 04/17/20 0941 Oral     SpO2 04/17/20 0941 97 %     Weight 04/17/20 0940 210 lb (95.3 kg)     Height 04/17/20 0940 6\' 1"  (1.854 m)     Head Circumference --      Peak Flow --      Pain Score 04/17/20 0940 10     Pain Loc --      Pain Edu? --      Excl. in [place]? --    Constitutional: Alert and oriented. Well appearing and in no distress. Eyes: Normal exam ENT      Head: Normocephalic and atraumatic.      Mouth/Throat: Mucous membranes are moist. Cardiovascular: Normal rate, regular rhythm.  Respiratory: Normal respiratory effort without tachypnea nor retractions. Breath sounds are clear Gastrointestinal: Soft and nontender. No distention.  No CVA tenderness.  Musculoskeletal: Nontender with normal range of motion in all extremities.  Neurologic:  Normal speech and language. No gross focal neurologic deficits  Skin:  Skin is warm, dry and intact.  Psychiatric: Mood and affect are normal.   ____________________________________________    EKG  EKG viewed and interpreted by myself shows normal sinus rhythm at 78 bpm with a narrow QRS, normal axis, normal intervals, no concerning ST changes.  ____________________________________________    RADIOLOGY  CT scan negative for acute abnormality  ____________________________________________   INITIAL IMPRESSION / ASSESSMENT AND PLAN / ED COURSE  Pertinent labs & imaging results that were  available during my care of the patient were reviewed by me and considered in my medical decision making (see chart for details).   Patient presents to the emergency department acute onset of sharp left flank pain 8/10 in severity.  Patient has a benign abdominal exam no CVA tenderness either.  Overall patient appears well.  We will dose Toradol, obtain a CT renal scan to further evaluate.  Patient's blood work is largely within normal limits, urinalysis pending.  CT scan is negative for acute abnormality.  Urinalysis is normal.  We will discharge patient on short course of pain medication have the patient follow-up with his doctor.  Discussed return precautions.  Erik Francis was evaluated in Emergency Department on 04/17/2020 for the symptoms described in the history of present illness. He was evaluated in the context of the global COVID-19 pandemic, which necessitated consideration that the patient might be at risk for infection with the SARS-CoV-2 virus that causes COVID-19. Institutional protocols and algorithms that pertain to the evaluation of patients at risk for COVID-19 are in a state of rapid change based on information released by regulatory bodies including the CDC and federal and state organizations. These policies and algorithms were followed during the patient's care in the ED.  ____________________________________________   FINAL CLINICAL IMPRESSION(S) / ED DIAGNOSES  Left flank pain   Harvest Dark, MD 04/17/20 1433

## 2020-05-23 ENCOUNTER — Ambulatory Visit (INDEPENDENT_AMBULATORY_CARE_PROVIDER_SITE_OTHER): Payer: 59 | Admitting: Family Medicine

## 2020-05-23 ENCOUNTER — Encounter: Payer: Self-pay | Admitting: Family Medicine

## 2020-05-23 ENCOUNTER — Other Ambulatory Visit: Payer: Self-pay

## 2020-05-23 VITALS — BP 122/79 | HR 79 | Temp 97.7°F | Resp 16 | Ht 72.0 in | Wt 250.0 lb

## 2020-05-23 DIAGNOSIS — Z0000 Encounter for general adult medical examination without abnormal findings: Secondary | ICD-10-CM | POA: Diagnosis not present

## 2020-05-23 DIAGNOSIS — K648 Other hemorrhoids: Secondary | ICD-10-CM

## 2020-05-23 DIAGNOSIS — Z1211 Encounter for screening for malignant neoplasm of colon: Secondary | ICD-10-CM

## 2020-05-23 DIAGNOSIS — I861 Scrotal varices: Secondary | ICD-10-CM | POA: Insufficient documentation

## 2020-05-23 NOTE — Progress Notes (Signed)
Subjective:    Patient ID: Erik Francis, male    DOB: 10-14-1980, 40 y.o.   MRN: [id number]  Erik Francis is a 39 y.o. male presenting on 05/23/2020 for Establish Care (Patient wants physical)  Here to establish with new PCP. He will need Annual Physical today.  HPI   Wellness / Prevention Reviewed lifestyle with balance diet, exercise Reviewed health wellness prevention topics  History of Scoliosis He was wearing back brace age [age] for scoliosis, he has some issues with back posture at times.  History of TB, Pulmonary History of TB age 35, 47, he was on medications for 1 year, hospitalized at [facility], with some scar tissue on lungs. Last CT done 06/2019, will need surveillance for lung scarring.  Health Maintenance:  Colon CA Screening: Never had colonoscopy or screening before. Currently with some history of prior rectal bleeding episodes with internal hemorrhoids (bright red) past few years with dx after prior ED visits. He has tried OTC suppository PRN. Currently today doing well without significant symptoms. He has known family history with PGF with colon cancer age 43, and multiple immediate family members with colon polyps both mother, father, and sister age 28, due to strong fam history of polyps. He is requesting referral to GI today  No flowsheet data found.  History reviewed. No pertinent past medical history. History reviewed. No pertinent surgical history. Social History   Socioeconomic History  . Marital status: Single    Spouse name: Not on file  . Number of children: Not on file  . Years of education: Not on file  . Highest education level: Not on file  Occupational History  . Not on file  Tobacco Use  . Smoking status: Current Every Day Smoker    Types: E-cigarettes  . Smokeless tobacco: Never Used  Substance and Sexual Activity  . Alcohol use: No  . Drug use: No  . Sexual activity: Not on file  Other Topics Concern  . Not on file  Social  History Narrative  . Not on file   Social Determinants of Health   Financial Resource Strain: Not on file  Food Insecurity: Not on file  Transportation Needs: Not on file  Physical Activity: Not on file  Stress: Not on file  Social Connections: Not on file  Intimate Partner Violence: Not on file   Family History  Problem Relation Age of Onset  . Colon polyps Mother   . Colon polyps Father   . Colon polyps Sister 68  . Colon cancer Maternal Grandfather 70       passed 53   Current Outpatient Medications on File Prior to Visit  Medication Sig  . [DISCONTINUED] fluticasone (FLONASE) 50 MCG/ACT nasal spray Place 2 sprays into both nostrils daily.   No current facility-administered medications on file prior to visit.    Review of Systems  Constitutional: Negative for activity change, appetite change, chills, diaphoresis, fatigue and fever.  HENT: Negative for congestion and hearing loss.   Eyes: Negative for visual disturbance.  Respiratory: Negative for apnea, cough, chest tightness, shortness of breath and wheezing.   Cardiovascular: Negative for chest pain, palpitations and leg swelling.  Gastrointestinal: Positive for anal bleeding. Negative for abdominal pain, blood in stool, constipation, diarrhea, nausea and vomiting.  Endocrine: Negative for cold intolerance.  Genitourinary: Negative for dysuria, frequency and hematuria.  Musculoskeletal: Negative for arthralgias and neck pain.  Skin: Negative for rash.  Allergic/Immunologic: Negative for environmental allergies.  Neurological: Negative for  dizziness, weakness, light-headedness, numbness and headaches.  Hematological: Negative for adenopathy.  Psychiatric/Behavioral: Negative for behavioral problems, dysphoric mood and sleep disturbance.   Per HPI unless specifically indicated above     Objective:    BP 122/79   Pulse 79   Temp 97.7 F (36.5 C) (Temporal)   Resp 16   Ht 6' (1.829 m)   Wt 250 lb (113.4 kg)    SpO2 98%   BMI 33.91 kg/m   Wt Readings from Last 3 Encounters:  05/23/20 250 lb (113.4 kg)  04/17/20 210 lb (95.3 kg)  05/26/19 215 lb (97.5 kg)    Physical Exam Vitals and nursing note reviewed.  Constitutional:      General: He is not in acute distress.    Appearance: He is well-developed and well-nourished. He is not diaphoretic.     Comments: Well-appearing, comfortable, cooperative  HENT:     Head: Normocephalic and atraumatic.     Mouth/Throat:     Mouth: Oropharynx is clear and moist.  Eyes:     General:        Right eye: No discharge.        Left eye: No discharge.     Extraocular Movements: EOM normal.     Conjunctiva/sclera: Conjunctivae normal.     Pupils: Pupils are equal, round, and reactive to light.  Neck:     Thyroid: No thyromegaly.  Cardiovascular:     Rate and Rhythm: Normal rate and regular rhythm.     Pulses: Intact distal pulses.     Heart sounds: Normal heart sounds. No murmur heard.   Pulmonary:     Effort: Pulmonary effort is normal. No respiratory distress.     Breath sounds: Normal breath sounds. No wheezing or rales.  Abdominal:     General: Bowel sounds are normal. There is no distension.     Palpations: Abdomen is soft. There is no mass.     Tenderness: There is no abdominal tenderness.  Genitourinary:    Comments: Rectal/DRE: Normal external exam without hemorrhoids fissures or abnormality. No DRE done today.  Male external GU exam done. Normal external genital exam. Normal hernia exam without any provoked herniation, pain or bulging bilateral inguinal canals.  Left sided scrotum with varicocele identified, asymptomatic. Musculoskeletal:        General: No tenderness or edema. Normal range of motion.     Cervical back: Normal range of motion and neck supple.     Comments: Upper / Lower Extremities: - Normal muscle tone, strength bilateral upper extremities 5/5, lower extremities 5/5  Lymphadenopathy:     Cervical: No cervical  adenopathy.  Skin:    General: Skin is warm and dry.     Findings: No erythema or rash.  Neurological:     Mental Status: He is alert and oriented to person, place, and time.     Comments: Distal sensation intact to light touch all extremities  Psychiatric:        Mood and Affect: Mood and affect normal.        Behavior: Behavior normal.     Comments: Well groomed, good eye contact, normal speech and thoughts      I have personally reviewed the radiology report from 06/15/19 CT Chest Scan.  CLINICAL DATA:  Lung nodule follow-up.  EXAM: CT CHEST WITHOUT CONTRAST  TECHNIQUE: Multidetector CT imaging of the chest was performed following the standard protocol without IV contrast.  COMPARISON:  CT chest dated July 16, 2013.  FINDINGS: Cardiovascular: The heart size is normal. There is no pericardial effusion. There are no significant atherosclerotic changes of the thoracic aorta. There is no thoracic aortic aneurysm.  Mediastinum/Nodes:  --No mediastinal or hilar lymphadenopathy.  --No axillary lymphadenopathy.  --No supraclavicular lymphadenopathy.  --Normal thyroid gland.  --The esophagus is unremarkable  Lungs/Pleura: Again noted is an opacity in the medial right lung base measuring approximately 1.1 cm (axial series 3, image 130). This previously measured approximately 1.1 cm on prior study dated July 16, 2013. There is a small calcified pleural based lesion more superiorly in the medial right lower lobe measuring approximately 6 mm (axial series 2, image 76). The lungs are otherwise clear with no evidence for pleural effusion or pneumothorax. The trachea is unremarkable.  Upper Abdomen: No acute abnormality.  Musculoskeletal: No chest wall abnormality. No acute or significant osseous findings.  IMPRESSION: 1. Stable 1.1 cm opacity in the medial right lower lobe favored to represent an area of chronic scarring. 2. Small 6 mm calcified pleural  based plaque involving the right lower lobe. This is stable from prior study. 3. No acute cardiopulmonary process. 4. No new pulmonary nodules.   Electronically Signed   By: Constance Holster M.D.   On: 06/15/2019 23:32  Results for orders placed or performed during the hospital encounter of 04/17/20  Lipase, blood  Result Value Ref Range   Lipase 25 11 - 51 U/L  Comprehensive metabolic panel  Result Value Ref Range   Sodium 137 135 - 145 mmol/L   Potassium 4.1 3.5 - 5.1 mmol/L   Chloride 105 98 - 111 mmol/L   CO2 23 22 - 32 mmol/L   Glucose, Bld 91 70 - 99 mg/dL   BUN 10 6 - 20 mg/dL   Creatinine, Ser 1.04 0.61 - 1.24 mg/dL   Calcium 9.3 8.9 - 10.3 mg/dL   Total Protein 6.9 6.5 - 8.1 g/dL   Albumin 4.2 3.5 - 5.0 g/dL   AST 21 15 - 41 U/L   ALT 24 0 - 44 U/L   Alkaline Phosphatase 66 38 - 126 U/L   Total Bilirubin 0.7 0.3 - 1.2 mg/dL   GFR, Estimated >60 >60 mL/min   Anion gap 9 5 - 15  CBC  Result Value Ref Range   WBC 9.2 4.0 - 10.5 K/uL   RBC 5.61 4.22 - 5.81 MIL/uL   Hemoglobin 16.0 13.0 - 17.0 g/dL   HCT 47.4 39.0 - 52.0 %   MCV 84.5 80.0 - 100.0 fL   MCH 28.5 26.0 - 34.0 pg   MCHC 33.8 30.0 - 36.0 g/dL   RDW 13.2 11.5 - 15.5 %   Platelets 181 150 - 400 K/uL   nRBC 0.0 0.0 - 0.2 %  Urinalysis, Complete w Microscopic Urine, Clean Catch  Result Value Ref Range   Color, Urine YELLOW (A) YELLOW   APPearance CLEAR (A) CLEAR   Specific Gravity, Urine 1.019 1.005 - 1.030   pH 5.0 5.0 - 8.0   Glucose, UA NEGATIVE NEGATIVE mg/dL   Hgb urine dipstick NEGATIVE NEGATIVE   Bilirubin Urine NEGATIVE NEGATIVE   Ketones, ur NEGATIVE NEGATIVE mg/dL   Protein, ur NEGATIVE NEGATIVE mg/dL   Nitrite NEGATIVE NEGATIVE   Leukocytes,Ua NEGATIVE NEGATIVE   RBC / HPF 0-5 0 - 5 RBC/hpf   WBC, UA NONE SEEN 0 - 5 WBC/hpf   Bacteria, UA NONE SEEN NONE SEEN   Squamous Epithelial / LPF NONE SEEN 0 - 5   Mucus  PRESENT       Assessment & Plan:   Problem List Items Addressed  This Visit    Left varicocele    Identified on exam and history - L Varicocele Stable without complication or symptom       Other Visit Diagnoses    Annual physical exam    -  Primary   Screening for colon cancer       Relevant Orders   Ambulatory referral to Gastroenterology   Internal hemorrhoid, bleeding       Relevant Orders   Ambulatory referral to Gastroenterology     Updated Health Maintenance information UTD COVID vaccine, Flu Shot Defer repeat blood work, last labs on chart 04/2020, future can plan on routine blood work including lipids and routine screening. Encouraged improvement to lifestyle with diet and exercise - Goal of weight loss  referral to GI for colonoscopy for age 56 year old patient who has personal history of rectal bleeding internal hemorrhoids for few years, no acute new symptoms now, he has fam history of multiple colon polyps at early age including sister age 74, and PGF with colon cancer age 36, requesting evaluation for colonoscopy, and is interested in future treatment for hemorrhoids if possible.   Orders Placed This Encounter  Procedures  . Ambulatory referral to Gastroenterology    Referral Priority:   Routine    Referral Type:   Consultation    Referral Reason:   Specialty Services Required    Number of Visits Requested:   1      No orders of the defined types were placed in this encounter.    Follow up plan: Return in about 1 year (around 05/23/2021) for 1 year for annual physical, can return sooner for hemorrhoids if needed.  Nobie Putnam, [facility] 05/23/2020, 3:25 PM

## 2020-05-23 NOTE — Assessment & Plan Note (Signed)
Identified on exam and history - L Varicocele Stable without complication or symptom

## 2020-05-23 NOTE — Patient Instructions (Addendum)
Thank you for coming to the office today.  [facility]) [address] [place], [place] [address] Phone: [phone]    Varicocele  A varicocele is a swelling of veins in the scrotum. The scrotum is the sac that contains the testicles. Varicoceles can occur on either side of the scrotum, but they are more common on the left side. They occur most often in teenage boys and young men. In most cases, varicoceles are not a serious problem. They are usually small and painless and do not require treatment. Tests may be done to confirm the diagnosis. Treatment may be needed if:  A varicocele is large, causes a lot of pain, or causes pain when exercising.  Varicoceles are found on both sides of the scrotum.  A varicocele causes a decrease in the size of the testicle in a growing adolescent.  The person has fertility problems. What are the causes? This condition is the result of valves in the veins not working properly. Valves in the veins help to return blood from the scrotum and testicles to the heart. If these valves do not work well, blood flows backward and backs up into the veins, which causes the veins to swell. This is similar to what happens when varicose veins form in the leg. What are the signs or symptoms? Most varicoceles do not cause any symptoms. If symptoms do occur, they may include:  Swelling on one side of the scrotum. The swelling may be more obvious when you are standing up.  A lumpy feeling in the scrotum.  A heavy feeling on one side of the scrotum.  A dull ache in the scrotum, especially after exercise or prolonged standing or sitting.  Slower growth or reduced size of the testicle on the side of the varicocele (in young males).  Problems with fathering a child (fertility). This can occur if the testicle does not grow normally or if the condition causes problems with the sperm, such as a low sperm count or sperm that are not  able to reach the egg (poor motility). How is this diagnosed? This condition is diagnosed based on:  Your medical history.  A physical exam. Your health care provider may inspect and feel (palpate) the scrotal area to check for swollen or enlarged veins.  An ultrasound. This may be done to confirm the diagnosis and to help rule out other causes of the swelling. How is this treated? Treatment is usually not needed for this condition. If you have any pain, your health care provider may prescribe or recommend medicine to help relieve it. You may need regular exams so your health care provider can monitor the varicocele to ensure that it does not cause problems. When further treatment is needed, it may involve one of these options:  Varicocelectomy. This is a surgery in which the swollen veins are tied off so that the flow of blood goes to other veins instead.  Embolization. In this procedure, a small, thin tube (catheter) is used to place metal coils or other blocking items in the veins. This cuts off the blood flow to the swollen veins. Follow these instructions at home:  Take over-the-counter and prescription medicines only as told by your health care provider.  Wear supportive underwear.  Use an athletic supporter when participating in sports activities.  Keep all follow-up visits as told by your health care provider. This is important. Contact a health care provider if:  Your pain is increasing.  You  have redness in the affected area.  Your testicle becomes enlarged, swollen, or painful.  You have swelling that does not decrease when you are lying down.  One of your testicles is smaller than the other. Get help right away if:  You develop swelling in your legs.  You have difficulty breathing. Summary  Varicocele is a condition in which the veins in the scrotum are swollen or enlarged.  In most cases, varicoceles do not require treatment.  Treatment may be needed if you  have pain, have problems with infertility, or have a smaller testicle associated with the varicocele.  In some cases, the condition may be treated with a procedure to cut off the flow of blood to the swollen veins. This information is not intended to replace advice given to you by your health care provider. Make sure you discuss any questions you have with your health care provider. Document Revised: 07/18/2018 Document Reviewed: 07/18/2018 Elsevier Patient Education  2021 [place].    Hemorrhoids Hemorrhoids are swollen veins that may develop:  In the butt (rectum). These are called internal hemorrhoids.  Around the opening of the butt (anus). These are called external hemorrhoids. Hemorrhoids can cause pain, itching, or bleeding. Most of the time, they do not cause serious problems. They usually get better with diet changes, lifestyle changes, and other home treatments. What are the causes? This condition may be caused by:  Having trouble pooping (constipation).  Pushing hard (straining) to poop.  Watery poop (diarrhea).  Pregnancy.  Being very overweight (obese).  Sitting for long periods of time.  Heavy lifting or other activity that causes you to strain.  Anal sex.  Riding a bike for a long period of time. What are the signs or symptoms? Symptoms of this condition include:  Pain.  Itching or soreness in the butt.  Bleeding from the butt.  Leaking poop.  Swelling in the area.  One or more lumps around the opening of your butt. How is this diagnosed? A doctor can often diagnose this condition by looking at the affected area. The doctor may also:  Do an exam that involves feeling the area with a gloved hand (digital rectal exam).  Examine the area inside your butt using a small tube (anoscope).  Order blood tests. This may be done if you have lost a lot of blood.  Have you get a test that involves looking inside the colon using a flexible tube with a  camera on the end (sigmoidoscopy or colonoscopy). How is this treated? This condition can usually be treated at home. Your doctor may tell you to change what you eat, make lifestyle changes, or try home treatments. If these do not help, procedures can be done to remove the hemorrhoids or make them smaller. These may involve:  Placing rubber bands at the base of the hemorrhoids to cut off their blood supply.  Injecting medicine into the hemorrhoids to shrink them.  Shining a type of light energy onto the hemorrhoids to cause them to fall off.  Doing surgery to remove the hemorrhoids or cut off their blood supply. Follow these instructions at home: Eating and drinking  Eat foods that have a lot of fiber in them. These include whole grains, beans, nuts, fruits, and vegetables.  Ask your doctor about taking products that have added fiber (fibersupplements).  Reduce the amount of fat in your diet. You can do this by: ? Eating low-fat dairy products. ? Eating less red meat. ? Avoiding processed  foods.  Drink enough fluid to keep your pee (urine) pale yellow.   Managing pain and swelling  Take a warm-water bath (sitz bath) for 20 minutes to ease pain. Do this 3-4 times a day. You may do this in a bathtub or using a portable sitz bath that fits over the toilet.  If told, put ice on the painful area. It may be helpful to use ice between your warm baths. ? Put ice in a plastic bag. ? Place a towel between your skin and the bag. ? Leave the ice on for 20 minutes, 2-3 times a day.   General instructions  Take over-the-counter and prescription medicines only as told by your doctor. ? Medicated creams and medicines may be used as told.  Exercise often. Ask your doctor how much and what kind of exercise is best for you.  Go to the bathroom when you have the urge to poop. Do not wait.  Avoid pushing too hard when you poop.  Keep your butt dry and clean. Use wet toilet paper or moist  towelettes after pooping.  Do not sit on the toilet for a long time.  Keep all follow-up visits as told by your doctor. This is important. Contact a doctor if you:  Have pain and swelling that do not get better with treatment or medicine.  Have trouble pooping.  Cannot poop.  Have pain or swelling outside the area of the hemorrhoids. Get help right away if you have:  Bleeding that will not stop. Summary  Hemorrhoids are swollen veins in the butt or around the opening of the butt.  They can cause pain, itching, or bleeding.  Eat foods that have a lot of fiber in them. These include whole grains, beans, nuts, fruits, and vegetables.  Take a warm-water bath (sitz bath) for 20 minutes to ease pain. Do this 3-4 times a day. This information is not intended to replace advice given to you by your health care provider. Make sure you discuss any questions you have with your health care provider. Document Revised: 05/08/2018 Document Reviewed: 09/19/2017 Elsevier Patient Education  [place].   Please schedule a Follow-up Appointment to: Return in about 1 year (around 05/23/2021) for 1 year for annual physical, can return sooner for hemorrhoids if needed.  If you have any other questions or concerns, please feel free to call the office or send a message through [facility]. You may also schedule an earlier appointment if necessary.  Additionally, you may be receiving a survey about your experience at our office within a few days to 1 week by e-mail or mail. We value your feedback.  Nobie Putnam, DO [place]

## 2020-06-30 ENCOUNTER — Other Ambulatory Visit: Payer: Self-pay

## 2020-06-30 ENCOUNTER — Ambulatory Visit (INDEPENDENT_AMBULATORY_CARE_PROVIDER_SITE_OTHER): Payer: 59 | Admitting: Gastroenterology

## 2020-06-30 ENCOUNTER — Encounter: Payer: Self-pay | Admitting: Gastroenterology

## 2020-06-30 VITALS — BP 145/74 | HR 94 | Temp 98.6°F | Ht 72.0 in | Wt 249.0 lb

## 2020-06-30 DIAGNOSIS — R194 Change in bowel habit: Secondary | ICD-10-CM | POA: Diagnosis not present

## 2020-06-30 DIAGNOSIS — K625 Hemorrhage of anus and rectum: Secondary | ICD-10-CM | POA: Diagnosis not present

## 2020-06-30 MED ORDER — NA SULFATE-K SULFATE-MG SULF 17.5-3.13-1.6 GM/177ML PO SOLN: 354.0000 mL | Freq: Once | ORAL | 0 refills | Status: AC

## 2020-06-30 NOTE — Progress Notes (Signed)
Cephas Darby, MD [address]  [place], [place] [address]  Main: [phone]  Fax: [phone]    Gastroenterology Consultation  Referring Provider:     Nobie Putnam * Primary Care Physician:  Olin Hauser, DO Primary Gastroenterologist:  Dr. Cephas Darby Reason for Consultation:     Rectal bleeding, altered bowel habits        HPI:   Erik Francis is a 40 y.o. male referred by Dr. Parks Ranger, Devonne Doughty, DO  for consultation & management of rectal bleeding and altered bowel habits.  Patient reports that for last 6 months, he has been experiencing bright red blood per rectum along with rectal pressure/swelling.  He also reports irregular bowel movements, varying from constipation to diarrhea.  He does acknowledge drinking sweet tea, carbonated beverages daily, consumes red meat, fried foods regularly.  He reports that for last 1 year, he has been trying to lose weight even though he is very active and lifts weight at his work.  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  He does smoke but does not drink alcohol.  He does acknowledge not drinking water at all.  He does report that his grandfather had colon cancer, his both parents had colon polyps.  Therefore, he would like to get the colonoscopy Labs revealed normal CBC, LFTs.  CT renal protocol incidentally revealed fatty liver  NSAIDs: None  Antiplts/Anticoagulants/Anti thrombotics: None  GI Procedures: None  History reviewed. No pertinent past medical history.  History reviewed. No pertinent surgical history.  Prior to Admission medications   Medication Sig Start Date End Date Taking? Authorizing Provider  fluticasone (FLONASE) 50 MCG/ACT nasal spray Place 2 sprays into both nostrils daily. 08/18/15 05/26/19  Hagler, Judithe Modest, PA-C    Family History  Problem Relation Age of Onset  . Colon polyps Mother   . Colon polyps Father   . Colon polyps Sister 60  . Colon cancer Maternal  Grandfather 14       passed 84     Social History   Tobacco Use  . Smoking status: Current Every Day Smoker    Types: E-cigarettes  . Smokeless tobacco: Never Used  Substance Use Topics  . Alcohol use: No  . Drug use: No    Allergies as of 06/30/2020  . (No Known Allergies)    Review of Systems:    All systems reviewed and negative except where noted in HPI.   Physical Exam:  BP (!) 145/74 (BP Location: Left Arm, Patient Position: Sitting, Cuff Size: Normal)   Pulse 94   Temp 98.6 F (37 C) (Oral)   Ht 6' (1.829 m)   Wt 249 lb (112.9 kg)   BMI 33.77 kg/m  No LMP for male patient.  General:   Alert,  Well-developed, well-nourished, pleasant and cooperative in NAD Head:  Normocephalic and atraumatic. Eyes:  Sclera clear, no icterus.   Conjunctiva pink. Ears:  Normal auditory acuity. Nose:  No deformity, discharge, or lesions. Mouth:  No deformity or lesions,oropharynx pink & moist. Neck:  Supple; no masses or thyromegaly. Lungs:  Respirations even and unlabored.  Clear throughout to auscultation.   No wheezes, crackles, or rhonchi. No acute distress. Heart:  Regular rate and rhythm; no murmurs, clicks, rubs, or gallops. Abdomen:  Normal bowel sounds. Soft, non-tender and non-distended without masses, hepatosplenomegaly or hernias noted.  No guarding or rebound tenderness.   Rectal: Not performed Msk:  Symmetrical without gross deformities. Good,  equal movement & strength bilaterally. Pulses:  Normal pulses noted. Extremities:  No clubbing or edema.  No cyanosis. Neurologic:  Alert and oriented x3;  grossly normal neurologically. Skin:  Intact without significant lesions or rashes. No jaundice. Lymph Nodes:  No significant cervical adenopathy. Psych:  Alert and cooperative. Normal mood and affect.  Imaging Studies: Reviewed  Assessment and Plan:   Erik Francis is a 40 y.o. present male with BMI 33.7, chronic tobacco use is seen in consultation for 6 months  history of bright red blood per rectum, irregular bowel habits  Strongly advised to stop all the carbonated beverages, sweet tea Discussed about adequate intake of water, at least 3 L daily Discussed about high-fiber diet, information provided Cut back on red meat Recommend diagnostic colonoscopy for rectal bleeding Discussed about hemorrhoid ligation if the colonoscopy is otherwise unremarkable after the procedure   Follow up after the colonoscopy   Cephas Darby, MD

## 2020-07-13 ENCOUNTER — Telehealth: Payer: Self-pay | Admitting: Gastroenterology

## 2020-07-13 MED ORDER — GOLYTELY 236 G PO SOLR: 4000.0000 mL | Freq: Once | ORAL | 0 refills | Status: AC

## 2020-07-13 NOTE — Telephone Encounter (Signed)
Patient cannot afford prep, he is opting for Miralax OTC per his pharmacist.  He called asking for directions from nurse on Miralax prep. Please calll to advise

## 2020-07-13 NOTE — Telephone Encounter (Signed)
Informed patient that our providers do not recommend the Miralax prep. Sent Goltely to his pharmacy. He states he will go pick this up. Gave instructions for the prep to him and sent to [facility]

## 2020-07-14 ENCOUNTER — Telehealth: Payer: Self-pay | Admitting: Gastroenterology

## 2020-07-14 NOTE — Telephone Encounter (Signed)
Practice Admin spoke with patient. Patient was sarcastic in regards to manager time in returning his call. Patient reports he wanted to change his location of his procedure so he does not have to see the front office staff. Practice admin informed patient that his procedure is at the hospital so he will not see our staff. Patient verbalized understanding.

## 2020-07-14 NOTE — Telephone Encounter (Signed)
Patient called demanding explanation of coding for his upcoming Colonoscopy.  I tried to explain the difference between a Screening and and Diagnostic Colonoscopy. Patient then started shouting, would not listen to any explanation.  I asked him to hold and asked Traci to speak with patient.

## 2020-07-14 NOTE — Telephone Encounter (Signed)
I spoke with Erik Francis to explain why he is a diagnostic procedure. He was yelling and kept cutting into what I was explaining and unable to speak to the patient. I tried to get him to calm down to no avail. I had to hang up on the patient.   He then called back, in a much calmer tone of voice. I explained that his referral from Dr. Rudie Meyer was for rectal bleeding and his age of 40 years old, would not be a screening but a diagnostic colonoscopy. He thanked me and hung up the phone.  Few minutes later, he called back and asked to speak to our manager, Marya Amsler. I relayed the message to her to please call the patient.

## 2020-07-15 ENCOUNTER — Other Ambulatory Visit
Admission: RE | Admit: 2020-07-15 | Discharge: 2020-07-15 | Disposition: A | Discharge status: Home / Self Care | Payer: 59 | Source: Ambulatory Visit | Attending: Gastroenterology | Admitting: Gastroenterology

## 2020-07-15 ENCOUNTER — Other Ambulatory Visit: Payer: Self-pay

## 2020-07-15 DIAGNOSIS — Z20822 Contact with and (suspected) exposure to covid-19: Secondary | ICD-10-CM | POA: Diagnosis not present

## 2020-07-15 DIAGNOSIS — Z01812 Encounter for preprocedural laboratory examination: Secondary | ICD-10-CM | POA: Diagnosis present

## 2020-07-15 LAB — SARS CORONAVIRUS 2 (TAT 6-24 HRS): SARS Coronavirus 2: NEGATIVE

## 2020-07-19 ENCOUNTER — Other Ambulatory Visit: Payer: Self-pay

## 2020-07-19 ENCOUNTER — Encounter: Payer: Self-pay | Admitting: Gastroenterology

## 2020-07-19 ENCOUNTER — Encounter
Admission: RE | Disposition: A | Discharge status: Home / Self Care | Payer: Self-pay | Source: Home / Self Care | Attending: Gastroenterology

## 2020-07-19 ENCOUNTER — Ambulatory Visit: Payer: 59 | Admitting: Anesthesiology

## 2020-07-19 ENCOUNTER — Ambulatory Visit
Admission: RE | Admit: 2020-07-19 | Discharge: 2020-07-19 | Disposition: A | Discharge status: Home / Self Care | Payer: 59 | Attending: Gastroenterology | Admitting: Gastroenterology

## 2020-07-19 DIAGNOSIS — K625 Hemorrhage of anus and rectum: Secondary | ICD-10-CM | POA: Diagnosis present

## 2020-07-19 DIAGNOSIS — K644 Residual hemorrhoidal skin tags: Secondary | ICD-10-CM | POA: Diagnosis not present

## 2020-07-19 DIAGNOSIS — F17210 Nicotine dependence, cigarettes, uncomplicated: Secondary | ICD-10-CM | POA: Diagnosis not present

## 2020-07-19 DIAGNOSIS — K635 Polyp of colon: Secondary | ICD-10-CM | POA: Diagnosis not present

## 2020-07-19 HISTORY — PX: COLONOSCOPY WITH PROPOFOL: SHX5780

## 2020-07-19 SURGERY — COLONOSCOPY WITH PROPOFOL
Anesthesia: General

## 2020-07-19 MED ORDER — SODIUM CHLORIDE 0.9 % IV SOLN: INTRAVENOUS | Status: DC

## 2020-07-19 MED ORDER — LIDOCAINE HCL (CARDIAC) PF 100 MG/5ML IV SOSY
PREFILLED_SYRINGE | INTRAVENOUS | Status: DC | PRN
  Administered 2020-07-19: 50 mg via INTRAVENOUS

## 2020-07-19 MED ORDER — MIDAZOLAM HCL 2 MG/2ML IJ SOLN
INTRAMUSCULAR | Status: AC
  Filled 2020-07-19: qty 2

## 2020-07-19 MED ORDER — ONDANSETRON HCL 4 MG PO TABS: 4.0000 mg | ORAL_TABLET | Freq: Three times a day (TID) | ORAL | 0 refills | Status: DC | PRN

## 2020-07-19 MED ORDER — PROPOFOL 500 MG/50ML IV EMUL
INTRAVENOUS | Status: DC | PRN
  Administered 2020-07-19: 120 ug/kg/min via INTRAVENOUS

## 2020-07-19 MED ORDER — MIDAZOLAM HCL 2 MG/2ML IJ SOLN
INTRAMUSCULAR | Status: DC | PRN
  Administered 2020-07-19: 2 mg via INTRAVENOUS

## 2020-07-19 MED ORDER — PROPOFOL 10 MG/ML IV BOLUS
INTRAVENOUS | Status: AC
  Filled 2020-07-19: qty 20

## 2020-07-19 MED ORDER — GOLYTELY 236 G PO SOLR: 4000.0000 mL | Freq: Once | ORAL | 0 refills | Status: AC

## 2020-07-19 MED ORDER — PROPOFOL 500 MG/50ML IV EMUL
INTRAVENOUS | Status: AC
  Filled 2020-07-19: qty 50

## 2020-07-19 MED ORDER — PROPOFOL 10 MG/ML IV BOLUS
INTRAVENOUS | Status: DC | PRN
  Administered 2020-07-19: 80 mg via INTRAVENOUS
  Administered 2020-07-19: 20 mg via INTRAVENOUS

## 2020-07-19 NOTE — Op Note (Signed)
[facility] Patient Name: Erik Francis Procedure Date: 07/19/2020 9:07 AM MRN: [id number] Account #: [id number] Date of Birth: 05/11/1981 Admit Type: Outpatient Age: 40 Room: [facility] ENDO ROOM 3 Gender: Male Note Status: Finalized Procedure:             Colonoscopy Indications:           Rectal bleeding Providers:             Lin Landsman MD, MD Referring MD:          Olin Hauser (Referring MD) Medicines:             General Anesthesia Complications:         No immediate complications. Estimated blood loss: None. Procedure:             Pre-Anesthesia Assessment:                        - Prior to the procedure, a History and Physical was                         performed, and patient medications and allergies were                         reviewed. The patient is competent. The risks and                         benefits of the procedure and the sedation options and                         risks were discussed with the patient. All questions                         were answered and informed consent was obtained.                         Patient identification and proposed procedure were                         verified by the physician, the nurse, the                         anesthesiologist, the anesthetist and the technician                         in the pre-procedure area in the procedure room in the                         endoscopy suite. Mental Status Examination: alert and                         oriented. Airway Examination: normal oropharyngeal                         airway and neck mobility. Respiratory Examination:                         clear to auscultation. CV Examination: normal.  Prophylactic Antibiotics: The patient does not require                         prophylactic antibiotics. Prior Anticoagulants: The                         patient has taken no previous anticoagulant or                          antiplatelet agents. ASA Grade Assessment: II - A                         patient with mild systemic disease. After reviewing                         the risks and benefits, the patient was deemed in                         satisfactory condition to undergo the procedure. The                         anesthesia plan was to use general anesthesia.                         Immediately prior to administration of medications,                         the patient was re-assessed for adequacy to receive                         sedatives. The heart rate, respiratory rate, oxygen                         saturations, blood pressure, adequacy of pulmonary                         ventilation, and response to care were monitored                         throughout the procedure. The physical status of the                         patient was re-assessed after the procedure.                        After obtaining informed consent, the colonoscope was                         passed under direct vision. Throughout the procedure,                         the patient's blood pressure, pulse, and oxygen                         saturations were monitored continuously. The                         Colonoscope was introduced through the anus and  advanced to the the cecum, identified by appendiceal                         orifice and ileocecal valve. The colonoscopy was                         performed with moderate difficulty due to poor bowel                         prep, significant looping and the patient's body                         habitus. Successful completion of the procedure was                         aided by applying abdominal pressure. The patient                         tolerated the procedure well. The quality of the bowel                         preparation was inadequate. Findings:      The perianal and digital rectal examinations were normal. Pertinent        negatives include normal sphincter tone and no palpable rectal lesions.      A 15 mm polyp was found in the ascending colon. The polyp was sessile.       Polypectomy was not attempted due to inadequate bowel preparation.      Non-bleeding external hemorrhoids were found during retroflexion. The       hemorrhoids were large.      Extensive amounts of copious quantities of semi-liquid stool was found       in the entire colon, precluding visualization. Impression:            - Preparation of the colon was inadequate.                        - One 15 mm polyp in the ascending colon. Resection                         not attempted.                        - Non-bleeding external hemorrhoids.                        - Stool in the entire examined colon.                        - No specimens collected. Recommendation:        - Discharge patient to home (with escort).                        - Clear liquid diet today.                        - Repeat colonoscopy tomorrow because the bowel                         preparation was  poor. Procedure Code(s):     --- Professional ---                        [phone], Colonoscopy, flexible; diagnostic, including                         collection of specimen(s) by brushing or washing, when                         performed (separate procedure) Diagnosis Code(s):     --- Professional ---                        K64.4, Residual hemorrhoidal skin tags                        K63.5, Polyp of colon                        K62.5, Hemorrhage of anus and rectum CPT copyright 2019 American Medical Association. All rights reserved. The codes documented in this report are preliminary and upon coder review may  be revised to meet current compliance requirements. Dr. Ulyess Mort Lin Landsman MD, MD 07/19/2020 9:43:50 AM This report has been signed electronically. Number of Addenda: 0 Note Initiated On: 07/19/2020 9:07 AM Scope Withdrawal Time: 0 hours 4 minutes 31  seconds  Total Procedure Duration: 0 hours 11 minutes 35 seconds  Estimated Blood Loss:  Estimated blood loss: none.      [facility]

## 2020-07-19 NOTE — Anesthesia Preprocedure Evaluation (Signed)
Anesthesia Evaluation  Patient identified by MRN, date of birth, ID band Patient awake    Reviewed: Allergy & Precautions, NPO status , Patient's Chart, lab work & pertinent test results  History of Anesthesia Complications Negative for: history of anesthetic complications  Airway Mallampati: II       Dental   Pulmonary neg sleep apnea, neg COPD, Current Smoker,           Cardiovascular (-) hypertension(-) Past MI and (-) CHF (-) dysrhythmias (-) Valvular Problems/Murmurs     Neuro/Psych neg Seizures    GI/Hepatic Neg liver ROS, neg GERD  ,  Endo/Other  neg diabetes  Renal/GU negative Renal ROS     Musculoskeletal   Abdominal   Peds  Hematology   Anesthesia Other Findings   Reproductive/Obstetrics                             Anesthesia Physical Anesthesia Plan  ASA: II  Anesthesia Plan: General   Post-op Pain Management:    Induction: Intravenous  PONV Risk Score and Plan: Propofol infusion and TIVA  Airway Management Planned: Nasal Cannula  Additional Equipment:   Intra-op Plan:   Post-operative Plan:   Informed Consent: I have reviewed the patients History and Physical, chart, labs and discussed the procedure including the risks, benefits and alternatives for the proposed anesthesia with the patient or authorized representative who has indicated his/her understanding and acceptance.       Plan Discussed with:   Anesthesia Plan Comments:         Anesthesia Quick Evaluation

## 2020-07-19 NOTE — Transfer of Care (Signed)
Immediate Anesthesia Transfer of Care Note  Patient: Erik Francis  Procedure(s) Performed: COLONOSCOPY WITH PROPOFOL (N/A )  Patient Location: PACU and Endoscopy Unit  Anesthesia Type:General  Level of Consciousness: drowsy and patient cooperative  Airway & Oxygen Therapy: Patient Spontanous Breathing  Post-op Assessment: Report given to RN and Post -op Vital signs reviewed and stable  Post vital signs: Reviewed and stable  Last Vitals:  Vitals Value Taken Time  BP 122/78 07/19/20 0945  Temp    Pulse 81 07/19/20 0950  Resp 22 07/19/20 0950  SpO2 95 % 07/19/20 0950  Vitals shown include unvalidated device data.  Last Pain:  Vitals:   07/19/20 0940  TempSrc: Temporal  PainSc:       Patients Stated Pain Goal: 0 (94/71/25 2712)  Complications: No complications documented.

## 2020-07-19 NOTE — H&P (Signed)
  Cephas Darby, MD [address]  [address]  [place], [place] [address]  Main: [phone]  Fax: [phone] Pager: [phone]  Primary Care Physician:  Olin Hauser, DO Primary Gastroenterologist:  Dr. Cephas Darby  Pre-Procedure History & Physical: HPI:  Erik Francis is a 40 y.o. male is here for an colonoscopy.   History reviewed. No pertinent past medical history.  History reviewed. No pertinent surgical history.  Prior to Admission medications   Medication Sig Start Date End Date Taking? Authorizing Provider  ibuprofen (ADVIL) 200 MG tablet Take 400 mg by mouth every 6 (six) hours as needed for headache.   Yes [provider]  fluticasone (FLONASE) 50 MCG/ACT nasal spray Place 2 sprays into both nostrils daily. 08/18/15 05/26/19  Hagler, Jami L, PA-C    Allergies as of 07/01/2020  . (No Known Allergies)    Family History  Problem Relation Age of Onset  . Colon polyps Mother   . Colon polyps Father   . Colon polyps Sister 30  . Colon cancer Maternal Grandfather 70       passed 26    Social History   Socioeconomic History  . Marital status: Single    Spouse name: Not on file  . Number of children: Not on file  . Years of education: Not on file  . Highest education level: Not on file  Occupational History  . Not on file  Tobacco Use  . Smoking status: Current Every Day Smoker    Packs/day: 0.50    Years: 14.00    Pack years: 7.00    Types: E-cigarettes  . Smokeless tobacco: Never Used  Substance and Sexual Activity  . Alcohol use: No  . Drug use: No  . Sexual activity: Not on file  Other Topics Concern  . Not on file  Social History Narrative  . Not on file   Social Determinants of Health   Financial Resource Strain: Not on file  Food Insecurity: Not on file  Transportation Needs: Not on file  Physical Activity: Not on file  Stress: Not on file  Social Connections: Not on file  Intimate Partner Violence: Not on file     Review of Systems: See HPI, otherwise negative ROS  Physical Exam: BP 129/86   Pulse 80   Temp (!) 97.3 F (36.3 C) (Temporal)   Resp 20   Ht 6\' 1"  (1.854 m)   Wt 111.1 kg   SpO2 97%   BMI 32.32 kg/m  General:   Alert,  pleasant and cooperative in NAD Head:  Normocephalic and atraumatic. Neck:  Supple; no masses or thyromegaly. Lungs:  Clear throughout to auscultation.    Heart:  Regular rate and rhythm. Abdomen:  Soft, nontender and nondistended. Normal bowel sounds, without guarding, and without rebound.   Neurologic:  Alert and  oriented x4;  grossly normal neurologically.  Impression/Plan: BRECCAN GALANT is here for an colonoscopy to be performed for rectal bleeding  Risks, benefits, limitations, and alternatives regarding  colonoscopy have been reviewed with the patient.  Questions have been answered.  All parties agreeable.   Sherri Sear, MD  07/19/2020, 8:10 AM

## 2020-07-19 NOTE — Progress Notes (Signed)
Poor prep today doing procedure tomorrow. Sent prep to the pharmacy per Dr. Marius Ditch orders

## 2020-07-19 NOTE — Anesthesia Postprocedure Evaluation (Signed)
Anesthesia Post Note  Patient: Erik Francis  Procedure(s) Performed: COLONOSCOPY WITH PROPOFOL (N/A )  Patient location during evaluation: Endoscopy Anesthesia Type: General Level of consciousness: awake and alert Pain management: pain level controlled Vital Signs Assessment: post-procedure vital signs reviewed and stable Respiratory status: spontaneous breathing and respiratory function stable Cardiovascular status: stable Anesthetic complications: no   No complications documented.   Last Vitals:  Vitals:   07/19/20 0805 07/19/20 0940  BP: 129/86 122/78  Pulse: 80 83  Resp: 20 11  Temp: (!) 36.3 C (!) 36.3 C  SpO2: 97% 96%    Last Pain:  Vitals:   07/19/20 0940  TempSrc: Temporal  PainSc:                  Alorah Mcree K

## 2020-07-20 ENCOUNTER — Ambulatory Visit: Payer: 59 | Admitting: Certified Registered"

## 2020-07-20 ENCOUNTER — Telehealth: Payer: Self-pay | Admitting: Internal Medicine

## 2020-07-20 ENCOUNTER — Encounter: Payer: Self-pay | Admitting: Gastroenterology

## 2020-07-20 ENCOUNTER — Ambulatory Visit
Admission: RE | Admit: 2020-07-20 | Discharge: 2020-07-20 | Disposition: A | Discharge status: Home / Self Care | Payer: 59 | Attending: Gastroenterology | Admitting: Gastroenterology

## 2020-07-20 ENCOUNTER — Encounter
Admission: RE | Disposition: A | Discharge status: Home / Self Care | Payer: Self-pay | Source: Home / Self Care | Attending: Gastroenterology

## 2020-07-20 DIAGNOSIS — K644 Residual hemorrhoidal skin tags: Secondary | ICD-10-CM | POA: Insufficient documentation

## 2020-07-20 DIAGNOSIS — Z800 Family history of malignant neoplasm of digestive organs: Secondary | ICD-10-CM | POA: Insufficient documentation

## 2020-07-20 DIAGNOSIS — K5730 Diverticulosis of large intestine without perforation or abscess without bleeding: Secondary | ICD-10-CM | POA: Insufficient documentation

## 2020-07-20 DIAGNOSIS — F17210 Nicotine dependence, cigarettes, uncomplicated: Secondary | ICD-10-CM | POA: Insufficient documentation

## 2020-07-20 DIAGNOSIS — Z8371 Family history of colonic polyps: Secondary | ICD-10-CM | POA: Insufficient documentation

## 2020-07-20 DIAGNOSIS — D125 Benign neoplasm of sigmoid colon: Secondary | ICD-10-CM | POA: Insufficient documentation

## 2020-07-20 DIAGNOSIS — D122 Benign neoplasm of ascending colon: Secondary | ICD-10-CM | POA: Diagnosis not present

## 2020-07-20 DIAGNOSIS — K625 Hemorrhage of anus and rectum: Secondary | ICD-10-CM | POA: Insufficient documentation

## 2020-07-20 HISTORY — PX: COLONOSCOPY WITH PROPOFOL: SHX5780

## 2020-07-20 SURGERY — COLONOSCOPY WITH PROPOFOL
Anesthesia: General

## 2020-07-20 MED ORDER — GLYCOPYRROLATE 0.2 MG/ML IJ SOLN
INTRAMUSCULAR | Status: AC
  Filled 2020-07-20: qty 2

## 2020-07-20 MED ORDER — SODIUM CHLORIDE 0.9 % IV SOLN
INTRAVENOUS | Status: DC
  Administered 2020-07-20: 20 mL/h via INTRAVENOUS

## 2020-07-20 MED ORDER — LIDOCAINE 2% (20 MG/ML) 5 ML SYRINGE
INTRAMUSCULAR | Status: DC | PRN
  Administered 2020-07-20: 25 mg via INTRAVENOUS

## 2020-07-20 MED ORDER — PROPOFOL 500 MG/50ML IV EMUL
INTRAVENOUS | Status: DC | PRN
  Administered 2020-07-20: 120 ug/kg/min via INTRAVENOUS

## 2020-07-20 MED ORDER — GLYCOPYRROLATE 0.2 MG/ML IJ SOLN
INTRAMUSCULAR | Status: DC | PRN
  Administered 2020-07-20: .2 mg via INTRAVENOUS

## 2020-07-20 MED ORDER — PROPOFOL 10 MG/ML IV BOLUS
INTRAVENOUS | Status: DC | PRN
  Administered 2020-07-20: 100 mg via INTRAVENOUS

## 2020-07-20 MED ORDER — MIDAZOLAM HCL 2 MG/2ML IJ SOLN
INTRAMUSCULAR | Status: AC
  Filled 2020-07-20: qty 2

## 2020-07-20 MED ORDER — MIDAZOLAM HCL 5 MG/5ML IJ SOLN
INTRAMUSCULAR | Status: DC | PRN
  Administered 2020-07-20: 2 mg via INTRAVENOUS

## 2020-07-20 NOTE — Transfer of Care (Signed)
Immediate Anesthesia Transfer of Care Note  Patient: Erik Francis  Procedure(s) Performed: COLONOSCOPY WITH PROPOFOL (N/A )  Patient Location: Endoscopy Unit  Anesthesia Type:General  Level of Consciousness: sedated  Airway & Oxygen Therapy: Patient Spontanous Breathing  Post-op Assessment: Report given to RN and Post -op Vital signs reviewed and stable  Post vital signs: Reviewed  Last Vitals:  Vitals Value Taken Time  BP 110/71 07/20/20 1000  Temp 35.7 C 07/20/20 1000  Pulse 74 07/20/20 1001  Resp 18 07/20/20 1001  SpO2 96 % 07/20/20 1001  Vitals shown include unvalidated device data.  Last Pain:  Vitals:   07/20/20 1000  TempSrc: Temporal  PainSc:          Complications: No complications documented.

## 2020-07-20 NOTE — Op Note (Signed)
[facility] Gastroenterology Patient Name: Erik Francis Procedure Date: 07/20/2020 9:14 AM MRN: [id number] Account #: [id number] Date of Birth: August 01, 1980 Admit Type: Outpatient Age: 40 Room: [facility] ENDO ROOM 4 Gender: Male Note Status: Finalized Procedure:             Colonoscopy Indications:           Therapeutic procedure for colon polyps, Rectal bleeding Providers:             Lin Landsman MD, MD Medicines:             General Anesthesia Complications:         No immediate complications. Estimated blood loss: None. Procedure:             Pre-Anesthesia Assessment:                        - Prior to the procedure, a History and Physical was                         performed, and patient medications and allergies were                         reviewed. The patient is competent. The risks and                         benefits of the procedure and the sedation options and                         risks were discussed with the patient. All questions                         were answered and informed consent was obtained.                         Patient identification and proposed procedure were                         verified by the physician, the nurse, the                         anesthesiologist, the anesthetist and the technician                         in the pre-procedure area in the procedure room in the                         endoscopy suite. Mental Status Examination: alert and                         oriented. Airway Examination: normal oropharyngeal                         airway and neck mobility. Respiratory Examination:                         clear to auscultation. CV Examination: normal.                         Prophylactic Antibiotics: The patient does  not require                         prophylactic antibiotics. Prior Anticoagulants: The                         patient has taken no previous anticoagulant or                         antiplatelet  agents. ASA Grade Assessment: II - A                         patient with mild systemic disease. After reviewing                         the risks and benefits, the patient was deemed in                         satisfactory condition to undergo the procedure. The                         anesthesia plan was to use general anesthesia.                         Immediately prior to administration of medications,                         the patient was re-assessed for adequacy to receive                         sedatives. The heart rate, respiratory rate, oxygen                         saturations, blood pressure, adequacy of pulmonary                         ventilation, and response to care were monitored                         throughout the procedure. The physical status of the                         patient was re-assessed after the procedure.                        After obtaining informed consent, the colonoscope was                         passed under direct vision. Throughout the procedure,                         the patient's blood pressure, pulse, and oxygen                         saturations were monitored continuously. The                         Colonoscope was introduced through the anus and  advanced to the the cecum, identified by appendiceal                         orifice and ileocecal valve. The colonoscopy was                         performed with moderate difficulty due to significant                         looping. Successful completion of the procedure was                         aided by applying abdominal pressure. The patient                         tolerated the procedure well. The quality of the bowel                         preparation was evaluated using the BBPS [facility] Bowel                         Preparation Scale) with scores of: Right Colon = 2                         (minor amount of residual staining, small fragments of                          stool and/or opaque liquid, but mucosa seen well),                         Transverse Colon = 2 (minor amount of residual                         staining, small fragments of stool and/or opaque                         liquid, but mucosa seen well) and Left Colon = 2                         (minor amount of residual staining, small fragments of                         stool and/or opaque liquid, but mucosa seen well). The                         total BBPS score equals 6. Findings:      The perianal and digital rectal examinations were normal. Pertinent       negatives include normal sphincter tone and no palpable rectal lesions.      A 20 mm polyp was found in the ascending colon. The polyp was sessile.       Preparations were made for mucosal resection. Eleview was injected to       raise the lesion. Snare mucosal resection was performed. Resection was       complete, and retrieval was complete. To prevent bleeding after mucosal       resection, six hemostatic clips were successfully placed (MR  conditional). There was no bleeding during, or at the end, of the       procedure. Estimated blood loss: none. Area was tattooed with an       injection of Spot (carbon black).      A 5 mm polyp was found in the sigmoid colon. The polyp was sessile. The       polyp was removed with a hot snare. Resection and retrieval were       complete.      Non-bleeding external hemorrhoids were found during retroflexion. The       hemorrhoids were large.      Copious quantities of liquid stool was found in the entire colon,       precluding visualization. Lavage of the area was performed using 50 -       200 mL of sterile water, resulting in clearance with fair visualization.      A few diverticula were found in the sigmoid colon.      The terminal ileum appeared normal. Impression:            - One 20 mm polyp in the ascending colon, removed with                         mucosal  resection. Resected and retrieved. Clips (MR                         conditional) were placed. Tattooed.                        - One 5 mm polyp in the sigmoid colon, removed with a                         hot snare. Resected and retrieved.                        - Non-bleeding external hemorrhoids.                        - Stool in the entire examined colon.                        - Diverticulosis in the sigmoid colon.                        - The examined portion of the ileum was normal.                        - Mucosal resection was performed. Resection was                         complete, and retrieval was complete. Recommendation:        - Discharge patient to home (with escort).                        - Resume previous diet today.                        - Continue present medications.                        - Await pathology results.                        -  Repeat colonoscopy with 2 day prep in 1 year for                         surveillance after piecemeal polypectomy. Procedure Code(s):     --- Professional ---                        [id number], Colonoscopy, flexible; with endoscopic mucosal                         resection                        45385, 47, Colonoscopy, flexible; with removal of                         tumor(s), polyp(s), or other lesion(s) by snare                         technique Diagnosis Code(s):     --- Professional ---                        K63.5, Polyp of colon                        K64.4, Residual hemorrhoidal skin tags                        K62.5, Hemorrhage of anus and rectum CPT copyright 2019 American Medical Association. All rights reserved. The codes documented in this report are preliminary and upon coder review may  be revised to meet current compliance requirements. Dr. Ulyess Mort Lin Landsman MD, MD 07/20/2020 10:03:04 AM This report has been signed electronically. Number of Addenda: 0 Note Initiated On: 07/20/2020 9:14 AM Scope  Withdrawal Time: 0 hours 26 minutes 35 seconds  Total Procedure Duration: 0 hours 31 minutes 58 seconds  Estimated Blood Loss:  Estimated blood loss: none.      Edward Plainfield

## 2020-07-20 NOTE — Anesthesia Preprocedure Evaluation (Addendum)
Anesthesia Evaluation  Patient identified by MRN, date of birth, ID band Patient awake    Reviewed: Allergy & Precautions, NPO status , Patient's Chart, lab work & pertinent test results  History of Anesthesia Complications Negative for: history of anesthetic complications  Airway Mallampati: II  TM Distance: >3 FB Neck ROM: Full    Dental no notable dental hx.    Pulmonary neg sleep apnea, neg COPD, Current Smoker and Patient abstained from smoking.,    breath sounds clear to auscultation- rhonchi (-) wheezing      Cardiovascular (-) hypertension(-) Past MI and (-) CHF (-) dysrhythmias (-) Valvular Problems/Murmurs Rhythm:Regular Rate:Normal - Systolic murmurs and - Diastolic murmurs    Neuro/Psych neg Seizures    GI/Hepatic Neg liver ROS, neg GERD  ,  Endo/Other  neg diabetes  Renal/GU negative Renal ROS     Musculoskeletal   Abdominal (+) + obese,   Peds  Hematology   Anesthesia Other Findings   Reproductive/Obstetrics                            Anesthesia Physical  Anesthesia Plan  ASA: II  Anesthesia Plan: General   Post-op Pain Management:    Induction: Intravenous  PONV Risk Score and Plan: 0 and Propofol infusion  Airway Management Planned: Nasal Cannula  Additional Equipment:   Intra-op Plan:   Post-operative Plan:   Informed Consent: I have reviewed the patients History and Physical, chart, labs and discussed the procedure including the risks, benefits and alternatives for the proposed anesthesia with the patient or authorized representative who has indicated his/her understanding and acceptance.       Plan Discussed with:   Anesthesia Plan Comments:        Anesthesia Quick Evaluation

## 2020-07-20 NOTE — Anesthesia Postprocedure Evaluation (Signed)
Anesthesia Post Note  Patient: Erik Francis  Procedure(s) Performed: COLONOSCOPY WITH PROPOFOL (N/A )  Patient location during evaluation: Endoscopy Anesthesia Type: General Level of consciousness: awake and alert and oriented Pain management: pain level controlled Vital Signs Assessment: post-procedure vital signs reviewed and stable Respiratory status: spontaneous breathing, nonlabored ventilation and respiratory function stable Cardiovascular status: blood pressure returned to baseline and stable Postop Assessment: no signs of nausea or vomiting Anesthetic complications: no   No complications documented.   Last Vitals:  Vitals:   07/20/20 1000 07/20/20 1010  BP: 110/71 112/74  Pulse: 73 67  Resp: 18 16  Temp: (!) 35.7 C   SpO2: 98% 96%    Last Pain:  Vitals:   07/20/20 1000  TempSrc: Temporal  PainSc:                  Michol Emory

## 2020-07-20 NOTE — H&P (Addendum)
  Erik Darby, MD [address]  [place]  [place], [place] [address]  Main: [phone]  Fax: [phone] Pager: [phone]  Primary Care Physician:  Olin Hauser, DO Primary Gastroenterologist:  Dr. Cephas Francis  Pre-Procedure History & Physical: HPI:  Erik Francis is a 40 y.o. male is here for an colonoscopy.   No past medical history on file.  Past Surgical History:  Procedure Laterality Date  . COLONOSCOPY WITH PROPOFOL N/A 07/19/2020   Procedure: COLONOSCOPY WITH PROPOFOL;  Surgeon: Lin Landsman, MD;  Location: [facility] ENDOSCOPY;  Service: Gastroenterology;  Laterality: N/A;    Prior to Admission medications   Medication Sig Start Date End Date Taking? Authorizing Provider  ondansetron (ZOFRAN) 4 MG tablet Take 1 tablet (4 mg total) by mouth every 8 (eight) hours as needed for nausea or vomiting. 07/19/20  Yes Erik Francis, Erik Due, MD  fluticasone (FLONASE) 50 MCG/ACT nasal spray Place 2 sprays into both nostrils daily. 08/18/15 05/26/19  Hagler, Jami L, PA-C    Allergies as of 07/19/2020  . (No Known Allergies)    Family History  Problem Relation Age of Onset  . Colon polyps Mother   . Colon polyps Father   . Colon polyps Sister 35  . Colon cancer Maternal Grandfather 70       passed 19    Social History   Socioeconomic History  . Marital status: Single    Spouse name: Not on file  . Number of children: Not on file  . Years of education: Not on file  . Highest education level: Not on file  Occupational History  . Not on file  Tobacco Use  . Smoking status: Current Every Day Smoker    Packs/day: 0.50    Years: 14.00    Pack years: 7.00    Types: E-cigarettes  . Smokeless tobacco: Never Used  Substance and Sexual Activity  . Alcohol use: No  . Drug use: No  . Sexual activity: Not on file  Other Topics Concern  . Not on file  Social History Narrative  . Not on file   Social Determinants of Health   Financial Resource  Strain: Not on file  Food Insecurity: Not on file  Transportation Needs: Not on file  Physical Activity: Not on file  Stress: Not on file  Social Connections: Not on file  Intimate Partner Violence: Not on file    Review of Systems: See HPI, otherwise negative ROS  Physical Exam: BP (!) 128/91   Pulse 76   Temp (!) 96.3 F (35.7 C) (Temporal)   Resp 16   Ht 6\' 1"  (1.854 m)   Wt 111.1 kg   SpO2 98%   BMI 32.32 kg/m  General:   Alert,  pleasant and cooperative in NAD Head:  Normocephalic and atraumatic. Neck:  Supple; no masses or thyromegaly. Lungs:  Clear throughout to auscultation.    Heart:  Regular rate and rhythm. Abdomen:  Soft, nontender and nondistended. Normal bowel sounds, without guarding, and without rebound.   Neurologic:  Alert and  oriented x4;  grossly normal neurologically.  Impression/Plan: Erik Francis is here for an colonoscopy to be performed for rectal bleeding  Risks, benefits, limitations, and alternatives regarding  colonoscopy have been reviewed with the patient.  Questions have been answered.  All parties agreeable.   Sherri Sear, MD  07/20/2020, 9:18 AM

## 2020-07-20 NOTE — Telephone Encounter (Signed)
Received After-Hours call from patient today reporting one episode of rectal bleeding after procedure. He had colonoscopy performed by Dr. Marius Ditch where 20 mm polyp was removed from ascending colon with 6 clips placed. No bleeding noted during procedure. Tattooed.   Patient reports he had one episode of painless bleeding after the procedure where he had some bright red blood in the toilet water and on the tissue paper. No clots seen. No pain with defecation. No fever. I advised the patient to continue to closely monitor his bowel habits. If he develops further episodes of hematochezia, he was advised to present to the ED. I also encouraged him to reach out to Dr. Verlin Grills office tomorrow and discuss getting his hemoglobin checked. ED precautions reviewed (fever, nausea, vomiting, acute abdominal pain, recurrent bleeding). He was also given the on-call # in case patient's symptoms worsen or he had questions.     Colonoscopy Impression: - One 20 mm polyp in the ascending colon, removed with mucosal resection. Resected and retrieved. Clips (MR conditional) were placed. Tattooed. - One 5 mm polyp in the sigmoid colon, removed with a hot snare. Resected and retrieved. - Non-bleeding external hemorrhoids. - Stool in the entire examined colon. - Diverticulosis in the sigmoid colon. - The examined portion of the ileum was normal. - Mucosal resection was performed. Resection was complete, and retrieval was complete.

## 2020-07-21 ENCOUNTER — Telehealth: Payer: Self-pay

## 2020-07-21 ENCOUNTER — Encounter: Payer: Self-pay | Admitting: Gastroenterology

## 2020-07-21 LAB — SURGICAL PATHOLOGY

## 2020-07-21 NOTE — Telephone Encounter (Signed)
Copied from [place] [id number]. Topic: General - Other >> Jul 20, 2020  5:23 PM Yvette Rack wrote: Reason for CRM: Pt called to get most recent lab results. Pt requests call back. Cb# [phone]   I called and spoke with the patient about the labs he was referencing (colonoscopy pathology results) and let him know that they are still in process from yesterday.  Dr Marius Ditch said to follow up with PCP in a few days. He didn't want to make an appt just yet but he will call me tomorrow to set that up from potentially next Monday.

## 2020-07-21 NOTE — Telephone Encounter (Signed)
Called patient, he is passing scant amounts of brbpr, one episode last night and one this morning. He is asymptomatic otherwise. Likely bleeding from hemorrhoids. Will monitor for now and explained about return to er precautions   Rohini Vanga

## 2020-07-22 ENCOUNTER — Encounter: Payer: Self-pay | Admitting: Gastroenterology

## 2020-07-22 NOTE — Telephone Encounter (Signed)
Called patient, he had bowel movement this morning and scant blood on wiping only.  He is  eating regular food.  He feels bloated than normal.  Asked him to try simethicone or Gas-X  I told him we are not dealing with any cancer  Karlina Suares

## 2020-07-29 ENCOUNTER — Encounter: Payer: Self-pay | Admitting: Gastroenterology

## 2020-07-29 ENCOUNTER — Other Ambulatory Visit: Payer: Self-pay

## 2020-07-29 ENCOUNTER — Ambulatory Visit (INDEPENDENT_AMBULATORY_CARE_PROVIDER_SITE_OTHER): Payer: 59 | Admitting: Gastroenterology

## 2020-07-29 VITALS — BP 145/92 | HR 74 | Temp 97.9°F | Ht 72.0 in | Wt 249.0 lb

## 2020-07-29 DIAGNOSIS — K5909 Other constipation: Secondary | ICD-10-CM | POA: Diagnosis not present

## 2020-07-29 DIAGNOSIS — K625 Hemorrhage of anus and rectum: Secondary | ICD-10-CM

## 2020-07-29 DIAGNOSIS — R140 Abdominal distension (gaseous): Secondary | ICD-10-CM | POA: Diagnosis not present

## 2020-07-29 DIAGNOSIS — K640 First degree hemorrhoids: Secondary | ICD-10-CM

## 2020-07-29 NOTE — Progress Notes (Signed)
Cephas Darby, MD [address]  [place]  [place], [place] [address]  Main: [phone]  Fax: [phone]    Gastroenterology Consultation  Referring Provider:     Nobie Putnam * Primary Care Physician:  Olin Hauser, DO Primary Gastroenterologist:  Dr. Cephas Darby Reason for Consultation:     Rectal bleeding, altered bowel habits        HPI:   Erik Francis is a 40 y.o. male referred by Dr. Parks Ranger, Devonne Doughty, DO  for consultation & management of rectal bleeding and altered bowel habits.  Patient reports that for last 6 months, he has been experiencing bright red blood per rectum along with rectal pressure/swelling.  He also reports irregular bowel movements, varying from constipation to diarrhea.  He does acknowledge drinking sweet tea, carbonated beverages daily, consumes red meat, fried foods regularly.  He reports that for last 1 year, he has been trying to lose weight even though he is very active and lifts weight at his work.  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  He does smoke but does not drink alcohol.  He does acknowledge not drinking water at all.  He does report that his grandfather had colon cancer, his both parents had colon polyps.  Therefore, he would like to get the colonoscopy Labs revealed normal CBC, LFTs.  CT renal protocol incidentally revealed fatty liver  Follow-up visit 07/29/2020 Patient reports abdominal bloating.  He reports that his constipation has resolved.  He is following healthy diet.  Eliminated red meat.  He underwent colonoscopy which revealed a large polyp in the ascending colon, it has been resected by EMR technique, pathology showed tubular adenoma only without evidence of dysplasia.  He reports scant blood per rectum and is interested to undergo hemorrhoid ligation.  NSAIDs: None  Antiplts/Anticoagulants/Anti thrombotics: None  GI Procedures:  Colonoscopy 07/20/2020 - One 20 mm polyp in the  ascending colon, removed with mucosal resection. Resected and retrieved. Clips (MR conditional) were placed. Tattooed. - One 5 mm polyp in the sigmoid colon, removed with a hot snare. Resected and retrieved. - Non-bleeding external hemorrhoids. - Stool in the entire examined colon. - Diverticulosis in the sigmoid colon. - The examined portion of the ileum was normal.  DIAGNOSIS:  A. COLON POLYP, ASCENDING; HOT SNARE:  - TUBULAR ADENOMA (MULTIPLE FRAGMENTS).  - HYPERPLASTIC POLYP (MULTIPLE FRAGMENTS).  - NEGATIVE FOR HIGH-GRADE DYSPLASIA AND MALIGNANCY.   B. COLON POLYP, SIGMOID; COLD SNARE:  - TUBULAR ADENOMA.  - NEGATIVE FOR HIGH-GRADE DYSPLASIA AND MALIGNANCY.   History reviewed. No pertinent past medical history.  Past Surgical History:  Procedure Laterality Date  . COLONOSCOPY WITH PROPOFOL N/A 07/19/2020   Procedure: COLONOSCOPY WITH PROPOFOL;  Surgeon: Lin Landsman, MD;  Location: [facility] ENDOSCOPY;  Service: Gastroenterology;  Laterality: N/A;  . COLONOSCOPY WITH PROPOFOL N/A 07/20/2020   Procedure: COLONOSCOPY WITH PROPOFOL;  Surgeon: Lin Landsman, MD;  Location: [facility] ENDOSCOPY;  Service: Gastroenterology;  Laterality: N/A;   No current outpatient medications on file.   Family History  Problem Relation Age of Onset  . Colon polyps Mother   . Colon polyps Father   . Colon polyps Sister 98  . Colon cancer Maternal Grandfather 20       passed 64     Social History   Tobacco Use  . Smoking status: Current Every Day Smoker    Packs/day: 0.50    Years: 14.00    Pack years: 7.00  Types: E-cigarettes  . Smokeless tobacco: Never Used  Substance Use Topics  . Alcohol use: No  . Drug use: No    Allergies as of 07/29/2020  . (No Known Allergies)    Review of Systems:    All systems reviewed and negative except where noted in HPI.   Physical Exam:  BP (!) 145/92 (BP Location: Left Arm, Patient Position: Sitting, Cuff Size: Normal)   Pulse 74   Temp  97.9 F (36.6 C) (Oral)   Ht 6' (1.829 m)   Wt 249 lb (112.9 kg)   BMI 33.77 kg/m  No LMP for male patient.  General:   Alert,  Well-developed, well-nourished, pleasant and cooperative in NAD Head:  Normocephalic and atraumatic. Eyes:  Sclera clear, no icterus.   Conjunctiva pink. Ears:  Normal auditory acuity. Nose:  No deformity, discharge, or lesions. Mouth:  No deformity or lesions,oropharynx pink & moist. Neck:  Supple; no masses or thyromegaly. Lungs:  Respirations even and unlabored.  Clear throughout to auscultation.   No wheezes, crackles, or rhonchi. No acute distress. Heart:  Regular rate and rhythm; no murmurs, clicks, rubs, or gallops. Abdomen:  Normal bowel sounds. Soft, non-tender and non-distended without masses, hepatosplenomegaly or hernias noted.  No guarding or rebound tenderness.   Rectal: Not performed Msk:  Symmetrical without gross deformities. Good, equal movement & strength bilaterally. Pulses:  Normal pulses noted. Extremities:  No clubbing or edema.  No cyanosis. Neurologic:  Alert and oriented x3;  grossly normal neurologically. Skin:  Intact without significant lesions or rashes. No jaundice. Psych:  Alert and cooperative. Normal mood and affect.  Imaging Studies: Reviewed  Assessment and Plan:   Erik Francis is a 40 y.o. present male with BMI 33.7, chronic tobacco use is seen in consultation for 6 months history of bright red blood per rectum, irregular bowel habits  Chronic constipation, significantly improved Continue high-fiber diet, adequate intake of water  Abdominal bloating Check H. pylori breath test  Rectal bleeding Secondary to hemorrhoids Discussed about hemorrhoid ligation, including risks and benefits Consent obtained, perform hemorrhoid ligation today  Tubular adenoma of the colon Repeat colonoscopy with 2-day prep in 1 year  Follow up in 2 to 3 weeks  Cephas Darby, MD

## 2020-07-29 NOTE — Progress Notes (Signed)

## 2020-07-31 LAB — H. PYLORI BREATH TEST: H pylori Breath Test: NEGATIVE

## 2020-08-01 ENCOUNTER — Ambulatory Visit: Payer: 59 | Admitting: Family Medicine

## 2020-08-03 ENCOUNTER — Ambulatory Visit (INDEPENDENT_AMBULATORY_CARE_PROVIDER_SITE_OTHER): Payer: 59 | Admitting: Family Medicine

## 2020-08-03 ENCOUNTER — Encounter: Payer: Self-pay | Admitting: Family Medicine

## 2020-08-03 ENCOUNTER — Other Ambulatory Visit: Payer: Self-pay

## 2020-08-03 VITALS — BP 116/77 | HR 66 | Ht 72.0 in | Wt 245.8 lb

## 2020-08-03 DIAGNOSIS — E669 Obesity, unspecified: Secondary | ICD-10-CM | POA: Diagnosis not present

## 2020-08-03 DIAGNOSIS — F17210 Nicotine dependence, cigarettes, uncomplicated: Secondary | ICD-10-CM | POA: Diagnosis not present

## 2020-08-03 DIAGNOSIS — Z720 Tobacco use: Secondary | ICD-10-CM

## 2020-08-03 DIAGNOSIS — R030 Elevated blood-pressure reading, without diagnosis of hypertension: Secondary | ICD-10-CM | POA: Diagnosis not present

## 2020-08-03 DIAGNOSIS — L989 Disorder of the skin and subcutaneous tissue, unspecified: Secondary | ICD-10-CM | POA: Diagnosis not present

## 2020-08-03 MED ORDER — NICOTINE 14 MG/24HR TD PT24: 14.0000 mg | MEDICATED_PATCH | Freq: Every day | TRANSDERMAL | 0 refills | Status: DC

## 2020-08-03 NOTE — Patient Instructions (Addendum)
Thank you for coming to the office today.  Once you are ready, stop smoking and start using patch  After 1 month can call or message we can reduce dose to 7mg  for 2-4 more weeks     Nicotine skin patches What is this medicine? NICOTINE ([place]) helps people stop smoking. The patches replace the nicotine found in cigarettes and help to decrease withdrawal effects. They are most effective when used in combination with a stop-smoking program. This medicine may be used for other purposes; ask your health care provider or pharmacist if you have questions. COMMON BRAND NAME(S): Habitrol, Nicoderm CQ, Nicotrol What should I tell my health care provider before I take this medicine? They need to know if you have any of these conditions:  diabetes  heart disease, angina, irregular heartbeat or previous heart attack  high blood pressure  lung disease, including asthma  overactive thyroid  pheochromocytoma  seizures or a history of seizures  skin problems, like eczema  stomach problems or ulcers  an unusual or allergic reaction to nicotine, adhesives, other medicines, foods, dyes, or preservatives  pregnant or trying to get pregnant  breast-feeding How should I use this medicine? This medicine is for external use only. Follow the directions on the label. Do not cut or trim the patch. After applying the patch, wash your hands. Change the patch every day in the morning, keeping to a regular schedule. When you apply a new patch, use a new area of skin. Wait at least 1 week before using the same area again. This drug comes with INSTRUCTIONS FOR USE. Ask your pharmacist for directions on how to use this drug. Read the information carefully. Talk to your pharmacist or health care provider if you have questions. Talk to your pediatrician regarding the use of this medicine in children. Special care may be needed. Overdosage: If you think you have taken too much of this medicine contact a  poison control center or emergency room at once. NOTE: This medicine is only for you. Do not share this medicine with others. What if I miss a dose? If you forget to replace a patch, use it as soon as you can. Only use one patch at a time and do not leave on the skin for longer than directed. If a patch falls off, you can replace it, but keep to your schedule and remove the patch at the right time. What may interact with this medicine?  certain medicines for lung or breathing disease, like asthma  medicines for blood pressure  medicines for depression This list may not describe all possible interactions. Give your health care provider a list of all the medicines, herbs, non-prescription drugs, or dietary supplements you use. Also tell them if you smoke, drink alcohol, or use illegal drugs. Some items may interact with your medicine. What should I watch for while using this medicine? Visit your health care provider for regular checks on your progress. Talk to your health care provider about what you can do to improve your chances of quitting. If you are going to need surgery, an MRI, CT scan, or other procedure, tell your health care provider that you are using this drug. You may need to remove the patch before the procedure. What side effects may I notice from receiving this medicine? Side effects that you should report to your doctor or health care professional as soon as possible:  allergic reactions like skin rash, itching or hives, swelling of the face, lips,  or tongue  breathing problems  changes in hearing  changes in vision  chest pain  cold sweats  confusion  fast, irregular heartbeat  feeling faint or lightheaded, falls  headache  increased saliva  skin redness that lasts more than 4 days  stomach pain  signs and symptoms of nicotine overdose like nausea; vomiting; dizziness; weakness; and rapid heartbeat Side effects that usually do not require medical  attention (report to your doctor or health care professional if they continue or are bothersome):  diarrhea  dry mouth  hiccups  irritability  nervousness or restlessness  trouble sleeping or vivid dreams This list may not describe all possible side effects. Call your doctor for medical advice about side effects. You may report side effects to FDA at 1-800-FDA-1088. Where should I keep my medicine? This product may have enough nicotine in it to make children and pets sick. Keep it away from children and pets. After using, throw away as directed on the package. Store at room temperature between 20 and 25 degrees C (68 and 77 degrees F). Protect from heat and light. Keep this medicine in the original container until ready to use. Throw away unused medicine after the expiration date. NOTE: This sheet is a summary. It may not cover all possible information. If you have questions about this medicine, talk to your doctor, pharmacist, or health care provider.  2021 Elsevier/Gold Standard (2019-04-16 16:54:39)   Please schedule a Follow-up Appointment to: Return in about 6 months (around 02/03/2021) for 6 month follow-up weight, BP check, smoking update.  If you have any other questions or concerns, please feel free to call the office or send a message through [place]. You may also schedule an earlier appointment if necessary.  Additionally, you may be receiving a survey about your experience at our office within a few days to 1 week by e-mail or mail. We value your feedback.  Erik Putnam, DO [place]

## 2020-08-03 NOTE — Progress Notes (Signed)
Subjective:    Patient ID: Erik Francis, male    DOB: 25-Dec-1980, 40 y.o.   MRN: [id number]  Erik Francis is a 40 y.o. male presenting on 08/03/2020 for Hypertension and bump on lip   HPI   Small Raised Lesion on Lip Reports >1 year small pale spot on upper Not caused any pain or bleeding. Has not changed, unsure if bit lip in past but has been there for a while.  Smoking Cessation Smoking since age 28, 1ppd He quit for 1 year in past He often can go all day without smoking, he feels craving with nicotine in evening and it's a craving that triggers it. He did quit in past and switched to vaping then quit for 3 weeks then resumed smoking. He is ready to quit, wants to try NRT  Dietary / Lifestyle / Obesity BMI >33 Improved diet, chicken [place] seafood, fruits, vegetables He has given up red meat Given up sodas and more water.  Elevated BP without dx HTN Reports elevated BP readings from GI specialist. No prior dx. Limited outside readings Current Meds - None   Denies CP, dyspnea, HA, edema, dizziness / lightheadedness   Health Maintenance:  Completed Colonoscopy Dr Marius Ditch 07/20/20, had a tubular adenoma removed, and repeat 2 day bowel prep in 1 year for repeat colonoscopy, had internal hemorrhoid ligation with resolution of bleeding.  Depression screen [facility] 2/9 08/03/2020  Decreased Interest 0  Down, Depressed, Hopeless 0  PHQ - 2 Score 0    Social History   Tobacco Use  . Smoking status: Current Every Day Smoker    Packs/day: 1.00    Years: 18.00    Pack years: 18.00    Types: E-cigarettes  . Smokeless tobacco: Never Used  Substance Use Topics  . Alcohol use: No  . Drug use: No    Review of Systems Per HPI unless specifically indicated above     Objective:    BP 116/77   Pulse 66   Ht 6' (1.829 m)   Wt 245 lb 12.8 oz (111.5 kg)   SpO2 98%   BMI 33.34 kg/m   Wt Readings from Last 3 Encounters:  08/03/20 245 lb 12.8 oz (111.5 kg)  07/29/20 249 lb  (112.9 kg)  07/20/20 245 lb (111.1 kg)    Physical Exam Vitals and nursing note reviewed.  Constitutional:      General: He is not in acute distress.    Appearance: He is well-developed. He is not diaphoretic.     Comments: Well-appearing, comfortable, cooperative  HENT:     Head: Normocephalic and atraumatic.  Eyes:     General:        Right eye: No discharge.        Left eye: No discharge.     Conjunctiva/sclera: Conjunctivae normal.  Cardiovascular:     Rate and Rhythm: Normal rate.  Pulmonary:     Effort: Pulmonary effort is normal.  Skin:    General: Skin is warm and dry.     Findings: No erythema or rash.  Neurological:     Mental Status: He is alert and oriented to person, place, and time.  Psychiatric:        Behavior: Behavior normal.     Comments: Well groomed, good eye contact, normal speech and thoughts        Results for orders placed or performed in visit on 07/29/20  H. pylori breath test  Result Value Ref Range  H pylori Breath Test Negative Negative      Assessment & Plan:   Problem List Items Addressed This Visit    Tobacco abuse   Relevant Medications   nicotine (NICODERM CQ) 14 mg/24hr patch   Obesity (BMI 30.0-34.9) - Primary   Cigarette nicotine dependence without complication   Relevant Medications   nicotine (NICODERM CQ) 14 mg/24hr patch     #Spot on Lip Unclear exact etiology, seems like distinct localized slightly pale submucosal lesion, could be very mild early cystlike spot, possible area that had abrasion or bite before and has residual tissue. No concerning signs or features. Will follow closely  #Obesity BMI >33 Encouraged proceed w/ lifestyle modification for wt loss   #Elevated BP without dx HTN Mildly elevated initial BP, repeat manual check improved. - Home BP readings limited  No known complications    Plan:  1. Encourage improved lifestyle - low sodium diet, regular exercise, smoking cessation 3. Start monitor BP  outside office, bring readings to next visit, if persistently >140/90 or new symptoms notify office sooner   #Nicotine Dependence Tobacco Abuse  Discussion today >5 minutes (<10 minutes) specifically on counseling on risks of tobacco use, complications, treatment, smoking cessation.  NRT Therapy with patches. 14mg  daily x 1 month can taper to 7mg  for 2 weeks after. Handout w/ info given for smoking cessation Will help improve BP    Meds ordered this encounter  Medications  . nicotine (NICODERM CQ) 14 mg/24hr patch    Sig: Place 1 patch (14 mg total) onto the skin daily. Can reduce to 7mg  patch daily after first month.    Dispense:  28 patch    Refill:  0      Follow up plan: Return in about 6 months (around 02/03/2021) for 6 month follow-up weight, BP check, smoking update.   Nobie Putnam, [facility] 08/03/2020, 9:49 AM

## 2020-08-23 ENCOUNTER — Other Ambulatory Visit: Payer: Self-pay

## 2020-08-23 ENCOUNTER — Ambulatory Visit (INDEPENDENT_AMBULATORY_CARE_PROVIDER_SITE_OTHER): Payer: 59 | Admitting: Gastroenterology

## 2020-08-23 ENCOUNTER — Encounter: Payer: Self-pay | Admitting: Gastroenterology

## 2020-08-23 VITALS — BP 128/84 | HR 72 | Temp 98.0°F | Wt 248.5 lb

## 2020-08-23 DIAGNOSIS — K640 First degree hemorrhoids: Secondary | ICD-10-CM

## 2020-08-23 DIAGNOSIS — K625 Hemorrhage of anus and rectum: Secondary | ICD-10-CM

## 2020-08-23 NOTE — Progress Notes (Signed)

## 2020-09-13 ENCOUNTER — Ambulatory Visit: Payer: 59 | Admitting: Gastroenterology

## 2020-09-28 ENCOUNTER — Ambulatory Visit: Payer: Self-pay

## 2020-09-28 ENCOUNTER — Ambulatory Visit: Payer: 59 | Admitting: Gastroenterology

## 2020-09-28 NOTE — Telephone Encounter (Signed)
Patient called and says he got a splinter in his left hand about 4 weeks ago while at work and he wasn't able to remove all of the splinter from what he could tell. He says he's been picking at it with a pin opening up the area. He says it has a hard area like a bump, white around the area, no redness that he could tell because it's in the inside of his hand. He says it's draining green puss. He says he's been cleaning it with peroxide, alcohol, and applying antibiotic ointment, but it's not closing and not getting better. He says his employer told him to go to a walk-in clinic, but with his insurance it's $50. He asked if Dr. Raliegh Ip is able to cut his hand to get the splinter out or is he going to send him somewhere else. I advised I wasn't sure and Dr. Raliegh Ip would have to determine that once he see's his hand. He says that he called the emergency room and was told they could cut it out and his insurance will pay for him to go, so he says he will just do that. I advised to follow up in a week with Dr. Raliegh Ip. Patient verbalized understanding.  Summary: advice   Pt stated he has had a splinter in his finger for 3 weeks, it is infected / has green puss / pt asked if it would come out on its own eventually or if he needs it removed by Dr Raliegh Ip.            Reason for Disposition . [1] Looks infected (spreading redness, pus) AND [2] no fever  Answer Assessment - Initial Assessment Questions 1. APPEARANCE of INJURY: "What does the injury look like?"      Flesh colored, darker colored 2. SIZE: "How large is the cut?"      No cut 3. BLEEDING: "Is it bleeding now?" If Yes, ask: "Is it difficult to stop?"      Yes it bleeds 4. LOCATION: "Where is the injury located?"      Left hand 5. ONSET: "How long ago did the injury occur?"     4 weeks ago 6. MECHANISM: "Tell me how it happened."      Splinter in hand from work, cut wood 7. TETANUS: "When was the last tetanus booster?"     1-2 months ago 8. PREGNANCY: "Is there  any chance you are pregnant?" "When was your last menstrual period?"     N/A  Protocols used: SKIN INJURY-A-AH

## 2020-09-29 ENCOUNTER — Emergency Department: Payer: 59

## 2020-09-29 ENCOUNTER — Other Ambulatory Visit: Payer: Self-pay

## 2020-09-29 ENCOUNTER — Encounter: Payer: Self-pay | Admitting: Emergency Medicine

## 2020-09-29 ENCOUNTER — Emergency Department
Admission: EM | Admit: 2020-09-29 | Discharge: 2020-09-29 | Disposition: A | Discharge status: Home / Self Care | Payer: 59 | Attending: Emergency Medicine | Admitting: Emergency Medicine

## 2020-09-29 DIAGNOSIS — W458XXA Other foreign body or object entering through skin, initial encounter: Secondary | ICD-10-CM | POA: Diagnosis not present

## 2020-09-29 DIAGNOSIS — S60552A Superficial foreign body of left hand, initial encounter: Secondary | ICD-10-CM | POA: Diagnosis present

## 2020-09-29 DIAGNOSIS — F17290 Nicotine dependence, other tobacco product, uncomplicated: Secondary | ICD-10-CM | POA: Diagnosis not present

## 2020-09-29 MED ORDER — CEPHALEXIN 500 MG PO CAPS: 500.0000 mg | ORAL_CAPSULE | Freq: Three times a day (TID) | ORAL | 0 refills | Status: DC

## 2020-09-29 NOTE — Discharge Instructions (Signed)
Call make an appointment with Dr. Posey Pronto who is the orthopedist at [facility].  His contact information and phone number is listed on your discharge papers.  A prescription for Keflex was sent to the pharmacy in the event that your hand begins turning red, swollen and tender.

## 2020-09-29 NOTE — ED Notes (Signed)
See triage note  Presents with possible splinter in left hand  States he noticed splinted about 4 weeks ago unable to remove all of splinter

## 2020-09-29 NOTE — ED Provider Notes (Signed)
[facility] Emergency Department Provider Note  ____________________________________________   None    (approximate)  I have reviewed the triage vital signs and the nursing notes.   HISTORY  Chief Complaint Hand Problem   HPI Erik Francis is a 40 y.o. male presents to the ED with history of a wooden splinter in his left hand.  He initially saw his PCP 4 weeks ago who put him on amoxicillin and told him that the splinter would work its way out.  Patient is still continuing to have pain and splinter is still there.  He rates pain as 0/10.        History reviewed. No pertinent past medical history.  Patient Active Problem List   Diagnosis Date Noted  . Obesity (BMI 30.0-34.9) 08/03/2020  . Cigarette nicotine dependence without complication 31/54/0086  . Tobacco abuse 08/03/2020  . Rectal bleeding   . Left varicocele 05/23/2020    Past Surgical History:  Procedure Laterality Date  . COLONOSCOPY WITH PROPOFOL N/A 07/19/2020   Procedure: COLONOSCOPY WITH PROPOFOL;  Surgeon: Lin Landsman, MD;  Location: [facility] ENDOSCOPY;  Service: Gastroenterology;  Laterality: N/A;  . COLONOSCOPY WITH PROPOFOL N/A 07/20/2020   Procedure: COLONOSCOPY WITH PROPOFOL;  Surgeon: Lin Landsman, MD;  Location: [facility] ENDOSCOPY;  Service: Gastroenterology;  Laterality: N/A;    Prior to Admission medications   Medication Sig Start Date End Date Taking? Authorizing Provider  cephALEXin (KEFLEX) 500 MG capsule Take 1 capsule (500 mg total) by mouth 3 (three) times daily. 09/29/20  Yes Letitia Neri L, PA-C  fluticasone (FLONASE) 50 MCG/ACT nasal spray Place 2 sprays into both nostrils daily. 08/18/15 05/26/19  Hagler, Jami L, PA-C    Allergies Patient has no known allergies.  Family History  Problem Relation Age of Onset  . Colon polyps Mother   . Colon polyps Father   . Colon polyps Sister 70  . Colon cancer Maternal Grandfather 70       passed 35    Social  History Social History   Tobacco Use  . Smoking status: Current Every Day Smoker    Packs/day: 1.00    Years: 18.00    Pack years: 18.00    Types: E-cigarettes  . Smokeless tobacco: Never Used  Substance Use Topics  . Alcohol use: No  . Drug use: No    Review of Systems Constitutional: No fever/chills Eyes: No visual changes. Cardiovascular: Denies chest pain. Respiratory: Denies shortness of breath. Gastrointestinal:  No nausea, no vomiting.   Musculoskeletal: Left hand pain. Skin: Negative for rash. Neurological: Negative for headaches, focal weakness or numbness. ____________________________________________   PHYSICAL EXAM:  VITAL SIGNS: ED Triage Vitals  Enc Vitals Group     BP 09/29/20 0728 (!) 127/96     Pulse Rate 09/29/20 0728 70     Resp 09/29/20 0728 16     Temp 09/29/20 0728 98 F (36.7 C)     Temp Source 09/29/20 0728 Oral     SpO2 09/29/20 0728 99 %     Weight 09/29/20 0716 248 lb 7.3 oz (112.7 kg)     Height 09/29/20 0716 6' (1.829 m)     Head Circumference --      Peak Flow --      Pain Score 09/29/20 0716 0     Pain Loc --      Pain Edu? --      Excl. in [place]? --     Constitutional: Alert and oriented.  Well appearing and in no acute distress. Eyes: Conjunctivae are normal.  Head: Atraumatic. Nose: No congestion/rhinnorhea. Mouth/Throat: Mucous membranes are moist.  Oropharynx non-erythematous. Neck: No stridor.   Cardiovascular: Normal rate, regular rhythm. Grossly normal heart sounds.  Good peripheral circulation. Respiratory: Normal respiratory effort.  No retractions. Lungs CTAB. Musculoskeletal: On examination of the left hand there is tenderness on palpation of the volar aspect into the palm without open wound or drainage.  Patient is able to move digits, motor and sensory function intact.  Capillary refills less than 3 seconds.  Radial pulse present.  Patient is able to flex and extend at his wrist.  He is unable to close his hand due to  foreign body sensation in the palm of his hand. Neurologic:  Normal speech and language. No gross focal neurologic deficits are appreciated.  Skin:  Skin is warm, dry and intact.  Psychiatric: Mood and affect are normal. Speech and behavior are normal.  ____________________________________________   LABS (all labs ordered are listed, but only abnormal results are displayed)  Labs Reviewed - No data to display ____________________________________________  RADIOLOGY I, Johnn Hai, personally viewed and evaluated these images (plain radiographs) as part of my medical decision making, as well as reviewing the written report by the radiologist.   Official radiology report(s): DG Hand Complete Left  Result Date: 09/29/2020 CLINICAL DATA:  40 year old male status post splinter injury 1 month ago with continued pain and swelling. Lesion between the 2nd and 3rd distal metacarpals on the palmar aspect. EXAM: LEFT HAND - COMPLETE 3+ VIEW COMPARISON:  None. FINDINGS: No radiopaque foreign body identified. No soft tissue gas. No osseous abnormality identified. Normal joint spaces and alignment. IMPRESSION: Negative. No foreign body identified but wood is generally not radiopaque. Electronically Signed   By: Genevie Ann M.D.   On: 09/29/2020 08:40    ____________________________________________   PROCEDURES  Procedure(s) performed (including Critical Care):  Procedures   ____________________________________________   INITIAL IMPRESSION / ASSESSMENT AND PLAN / ED COURSE  As part of my medical decision making, I reviewed the following data within the electronic MEDICAL RECORD NUMBER Notes from prior ED visits and  Controlled Substance Database  40 year old male presents to the ED with complaint of possible wooden splinter in his left hand for 4 weeks.  Patient went to his PCP who placed him on amoxicillin to keep the area from getting infected and states that he was told that the splinter  would work its way out.  Patient still continues to have problems with his left hand.  On exam today there is no evidence of drainage or infection.  Palm of his hand is moderately tender to palpation.  No metallic foreign body is noted on x-ray which does not exclude a wooden splinter.  Patient was given a prescription for Keflex to begin taking should he develop any erythema over the weekend.  He is given information to contact and make an appointment with Dr. Posey Pronto who is the orthopedist on-call for [facility] today.  ____________________________________________   FINAL CLINICAL IMPRESSION(S) / ED DIAGNOSES  Final diagnoses:  Foreign body of left hand, initial encounter     ED Discharge Orders         Ordered    cephALEXin (KEFLEX) 500 MG capsule  3 times daily        09/29/20 0857          *Please note:  Erik Francis was evaluated in Emergency Department on 09/29/2020 for  the symptoms described in the history of present illness. He was evaluated in the context of the global COVID-19 pandemic, which necessitated consideration that the patient might be at risk for infection with the SARS-CoV-2 virus that causes COVID-19. Institutional protocols and algorithms that pertain to the evaluation of patients at risk for COVID-19 are in a state of rapid change based on information released by regulatory bodies including the CDC and federal and state organizations. These policies and algorithms were followed during the patient's care in the ED.  Some ED evaluations and interventions may be delayed as a result of limited staffing during and the pandemic.*   Note:  This document was prepared using Dragon voice recognition software and may include unintentional dictation errors.    Johnn Hai, PA-C 09/29/20 [id number]    Lavonia Drafts, MD 09/29/20 [phone]

## 2020-09-29 NOTE — ED Triage Notes (Signed)
C/O splinter to left hand x 4 weeks.  States wound now has pus coming from it.  Has been treated with antibiotics by PCP.

## 2020-11-01 ENCOUNTER — Ambulatory Visit (INDEPENDENT_AMBULATORY_CARE_PROVIDER_SITE_OTHER): Payer: 59 | Admitting: Gastroenterology

## 2020-11-01 ENCOUNTER — Other Ambulatory Visit: Payer: Self-pay

## 2020-11-01 ENCOUNTER — Encounter: Payer: Self-pay | Admitting: Gastroenterology

## 2020-11-01 VITALS — BP 121/82 | HR 69 | Temp 97.5°F | Ht 72.0 in | Wt 245.5 lb

## 2020-11-01 DIAGNOSIS — K625 Hemorrhage of anus and rectum: Secondary | ICD-10-CM

## 2020-11-01 DIAGNOSIS — K640 First degree hemorrhoids: Secondary | ICD-10-CM

## 2020-11-01 NOTE — Progress Notes (Signed)
PROCEDURE NOTE: The patient presents with symptomatic grade 1 hemorrhoids, unresponsive to maximal medical therapy, requesting rubber band ligation of his/her hemorrhoidal disease.  All risks, benefits and alternative forms of therapy were described and informed consent was obtained.  The decision was made to band the LL internal hemorrhoid, and the [facility] System was used to perform band ligation without complication.  Digital anorectal examination was then performed to assure proper positioning of the band, and to adjust the banded tissue as required.  The patient was discharged home without pain or other issues.  Dietary and behavioral recommendations were given and (if necessary - prescriptions were given), along with follow-up instructions.  The patient will return 2 weeks for follow-up and possible additional banding as required.  No complications were encountered and the patient tolerated the procedure well.    

## 2021-02-07 ENCOUNTER — Ambulatory Visit: Payer: 59 | Admitting: Family Medicine

## 2021-06-05 ENCOUNTER — Other Ambulatory Visit: Payer: Self-pay

## 2021-06-05 DIAGNOSIS — Z86010 Personal history of colonic polyps: Secondary | ICD-10-CM

## 2021-06-05 MED ORDER — NA SULFATE-K SULFATE-MG SULF 17.5-3.13-1.6 GM/177ML PO SOLN: 2.0000 | Freq: Once | ORAL | 0 refills | Status: AC

## 2021-06-15 ENCOUNTER — Encounter: Payer: Self-pay | Admitting: Gastroenterology

## 2021-06-16 ENCOUNTER — Ambulatory Visit: Payer: Self-pay | Admitting: Family Medicine

## 2021-06-16 ENCOUNTER — Other Ambulatory Visit: Payer: Self-pay

## 2021-06-16 MED ORDER — GOLYTELY 236 G PO SOLR: 4000.0000 mL | Freq: Once | ORAL | 0 refills | Status: AC

## 2021-06-16 NOTE — Progress Notes (Signed)
Patient states prep is to high and wants Golytely.

## 2021-07-28 ENCOUNTER — Ambulatory Visit: Payer: BLUE CROSS/BLUE SHIELD | Admitting: Anesthesiology

## 2021-07-28 ENCOUNTER — Encounter
Admission: RE | Disposition: A | Discharge status: Home / Self Care | Payer: Self-pay | Source: Home / Self Care | Attending: Gastroenterology

## 2021-07-28 ENCOUNTER — Ambulatory Visit
Admission: RE | Admit: 2021-07-28 | Discharge: 2021-07-28 | Disposition: A | Discharge status: Home / Self Care | Payer: BLUE CROSS/BLUE SHIELD | Attending: Gastroenterology | Admitting: Gastroenterology

## 2021-07-28 ENCOUNTER — Encounter: Payer: Self-pay | Admitting: Gastroenterology

## 2021-07-28 DIAGNOSIS — K644 Residual hemorrhoidal skin tags: Secondary | ICD-10-CM | POA: Diagnosis not present

## 2021-07-28 DIAGNOSIS — Z860100 Personal history of colon polyps, unspecified: Secondary | ICD-10-CM

## 2021-07-28 DIAGNOSIS — Z6832 Body mass index (BMI) 32.0-32.9, adult: Secondary | ICD-10-CM | POA: Insufficient documentation

## 2021-07-28 DIAGNOSIS — E669 Obesity, unspecified: Secondary | ICD-10-CM | POA: Diagnosis not present

## 2021-07-28 DIAGNOSIS — F17210 Nicotine dependence, cigarettes, uncomplicated: Secondary | ICD-10-CM | POA: Insufficient documentation

## 2021-07-28 DIAGNOSIS — Z09 Encounter for follow-up examination after completed treatment for conditions other than malignant neoplasm: Secondary | ICD-10-CM | POA: Insufficient documentation

## 2021-07-28 HISTORY — PX: COLONOSCOPY WITH PROPOFOL: SHX5780

## 2021-07-28 SURGERY — COLONOSCOPY WITH PROPOFOL
Anesthesia: General

## 2021-07-28 MED ORDER — EPHEDRINE 5 MG/ML INJ
INTRAVENOUS | Status: AC
  Filled 2021-07-28: qty 5

## 2021-07-28 MED ORDER — LIDOCAINE HCL (PF) 2 % IJ SOLN
INTRAMUSCULAR | Status: AC
  Filled 2021-07-28: qty 5

## 2021-07-28 MED ORDER — LIDOCAINE HCL (PF) 2 % IJ SOLN
INTRAMUSCULAR | Status: AC
  Filled 2021-07-28: qty 15

## 2021-07-28 MED ORDER — DEXMEDETOMIDINE (PRECEDEX) IN NS 20 MCG/5ML (4 MCG/ML) IV SYRINGE
PREFILLED_SYRINGE | INTRAVENOUS | Status: DC | PRN
  Administered 2021-07-28: 12 ug via INTRAVENOUS

## 2021-07-28 MED ORDER — GLYCOPYRROLATE 0.2 MG/ML IJ SOLN
INTRAMUSCULAR | Status: AC
  Filled 2021-07-28: qty 1

## 2021-07-28 MED ORDER — PROPOFOL 500 MG/50ML IV EMUL
INTRAVENOUS | Status: AC
  Filled 2021-07-28: qty 250

## 2021-07-28 MED ORDER — LIDOCAINE HCL (CARDIAC) PF 100 MG/5ML IV SOSY
PREFILLED_SYRINGE | INTRAVENOUS | Status: DC | PRN
Start: 2021-07-28 — End: 2021-07-28
  Administered 2021-07-28: 100 mg via INTRAVENOUS

## 2021-07-28 MED ORDER — PHENYLEPHRINE 40 MCG/ML (10ML) SYRINGE FOR IV PUSH (FOR BLOOD PRESSURE SUPPORT)
PREFILLED_SYRINGE | INTRAVENOUS | Status: AC
  Filled 2021-07-28: qty 10

## 2021-07-28 MED ORDER — PROPOFOL 10 MG/ML IV BOLUS
INTRAVENOUS | Status: DC | PRN
  Administered 2021-07-28: 30 mg via INTRAVENOUS
  Administered 2021-07-28: 100 mg via INTRAVENOUS

## 2021-07-28 MED ORDER — PROPOFOL 500 MG/50ML IV EMUL
INTRAVENOUS | Status: AC
  Filled 2021-07-28: qty 50

## 2021-07-28 MED ORDER — PROPOFOL 500 MG/50ML IV EMUL
INTRAVENOUS | Status: DC | PRN
Start: 2021-07-28 — End: 2021-07-28
  Administered 2021-07-28: 150 ug/kg/min via INTRAVENOUS

## 2021-07-28 MED ORDER — SODIUM CHLORIDE 0.9 % IV SOLN
INTRAVENOUS | Status: DC
  Administered 2021-07-28: 20 mL/h via INTRAVENOUS

## 2021-07-28 MED ORDER — PHENYLEPHRINE HCL (PRESSORS) 10 MG/ML IV SOLN
INTRAVENOUS | Status: DC | PRN
Start: 2021-07-28 — End: 2021-07-28
  Administered 2021-07-28: 80 ug via INTRAVENOUS

## 2021-07-28 NOTE — Op Note (Signed)
[facility] ?Gastroenterology ?Patient Name: Erik Francis ?Procedure Date: 07/28/2021 8:14 AM ?MRN: [id number] ?Account #: [id number] ?Date of Birth: 10-20-1980 ?Admit Type: Outpatient ?Age: 41 ?Room: [facility] ENDO ROOM 2 ?Gender: Male ?Note Status: Finalized ?Instrument Name: Colonoscope [id number] ?Procedure:             Colonoscopy ?Indications:           Surveillance: Personal history of adenomatous polyps  ?                       on last colonoscopy 3 years ago ?Providers:             Lin Landsman MD, MD ?Referring MD:          Olin Hauser (Referring MD) ?Medicines:             General Anesthesia ?Complications:         No immediate complications. Estimated blood loss: None. ?Procedure:             Pre-Anesthesia Assessment: ?                       - Prior to the procedure, a History and Physical was  ?                       performed, and patient medications and allergies were  ?                       reviewed. The patient is competent. The risks and  ?                       benefits of the procedure and the sedation options and  ?                       risks were discussed with the patient. All questions  ?                       were answered and informed consent was obtained.  ?                       Patient identification and proposed procedure were  ?                       verified by the physician, the nurse, the  ?                       anesthesiologist, the anesthetist and the technician  ?                       in the pre-procedure area in the procedure room in the  ?                       endoscopy suite. Mental Status Examination: alert and  ?                       oriented. Airway Examination: normal oropharyngeal  ?                       airway and neck mobility. Respiratory Examination:  ?  clear to auscultation. CV Examination: normal.  ?                       Prophylactic Antibiotics: The patient does not require  ?                        prophylactic antibiotics. Prior Anticoagulants: The  ?                       patient has taken no previous anticoagulant or  ?                       antiplatelet agents. ASA Grade Assessment: II - A  ?                       patient with mild systemic disease. After reviewing  ?                       the risks and benefits, the patient was deemed in  ?                       satisfactory condition to undergo the procedure. The  ?                       anesthesia plan was to use general anesthesia.  ?                       Immediately prior to administration of medications,  ?                       the patient was re-assessed for adequacy to receive  ?                       sedatives. The heart rate, respiratory rate, oxygen  ?                       saturations, blood pressure, adequacy of pulmonary  ?                       ventilation, and response to care were monitored  ?                       throughout the procedure. The physical status of the  ?                       patient was re-assessed after the procedure. ?                       After obtaining informed consent, the colonoscope was  ?                       passed under direct vision. Throughout the procedure,  ?                       the patient's blood pressure, pulse, and oxygen  ?                       saturations were monitored continuously. The  ?  Colonoscope was introduced through the anus and  ?                       advanced to the the cecum, identified by appendiceal  ?                       orifice and ileocecal valve. The colonoscopy was  ?                       performed without difficulty. The patient tolerated  ?                       the procedure well. The quality of the bowel  ?                       preparation was evaluated using the BBPS [facility] Bowel  ?                       Preparation Scale) with scores of: Right Colon = 3,  ?                       Transverse Colon = 3 and Left Colon = 3 (entire mucosa  ?                        seen well with no residual staining, small fragments  ?                       of stool or opaque liquid). The total BBPS score  ?                       equals 9. ?Findings: ?     The perianal and digital rectal examinations were normal. Pertinent  ?     negatives include normal sphincter tone and no palpable rectal lesions. ?     A 5 mm post polypectomy scar was found in the ascending colon. The scar  ?     tissue was healthy in appearance. ?     Non-bleeding external hemorrhoids were found during retroflexion. The  ?     hemorrhoids were medium-sized. ?     The exam was otherwise without abnormality. ?Impression:            - Post-polypectomy scar in the ascending colon. ?                       - Non-bleeding external hemorrhoids. ?                       - The examination was otherwise normal. ?                       - No specimens collected. ?Recommendation:        - Discharge patient to home (with escort). ?                       - Resume previous diet today. ?                       - Continue present medications. ?                       -  Repeat colonoscopy in 10 years for screening  ?                       purposes. ?Procedure Code(s):     --- Professional --- ?                       G0105, Colorectal cancer screening; colonoscopy on  ?                       individual at high risk ?Diagnosis Code(s):     --- Professional --- ?                       Z86.010, Personal history of colonic polyps ?                       Z98.890, Other specified postprocedural states ?                       K64.4, Residual hemorrhoidal skin tags ?CPT copyright 2019 American Medical Association. All rights reserved. ?The codes documented in this report are preliminary and upon coder review may  ?be revised to meet current compliance requirements. ?Dr. Ulyess Mort ?Darsh Vandevoort Raeanne Gathers MD, MD ?07/28/2021 8:38:21 AM ?This report has been signed electronically. ?Number of Addenda: 0 ?Note Initiated On: 07/28/2021 8:14  AM ?Scope Withdrawal Time: 0 hours 10 minutes 47 seconds  ?Total Procedure Duration: 0 hours 13 minutes 53 seconds  ?Estimated Blood Loss:  Estimated blood loss: none. ?     [facility] ?

## 2021-07-28 NOTE — Anesthesia Postprocedure Evaluation (Signed)
Anesthesia Post Note ? ?Patient: Erik Francis ? ?Procedure(s) Performed: COLONOSCOPY WITH PROPOFOL ? ?Patient location during evaluation: Endoscopy ?Anesthesia Type: General ?Level of consciousness: awake and alert ?Pain management: pain level controlled ?Vital Signs Assessment: post-procedure vital signs reviewed and stable ?Respiratory status: spontaneous breathing, nonlabored ventilation and respiratory function stable ?Cardiovascular status: blood pressure returned to baseline and stable ?Postop Assessment: no apparent nausea or vomiting ?Anesthetic complications: no ? ? ?No notable events documented. ? ? ?Last Vitals:  ?Vitals:  ? 07/28/21 0851 07/28/21 0859  ?BP: 96/71 100/70  ?Pulse: 75 70  ?Resp: 14   ?Temp:    ?SpO2: 96% 97%  ?  ?Last Pain:  ?Vitals:  ? 07/28/21 0841  ?TempSrc: Temporal  ?PainSc: 0-No pain  ? ? ?  ?  ?  ?  ?  ?  ? ?[place] Ouch ? ? ? ? ?

## 2021-07-28 NOTE — Transfer of Care (Signed)
Immediate Anesthesia Transfer of Care Note ? ?Patient: Erik Francis ? ?Procedure(s) Performed: COLONOSCOPY WITH PROPOFOL ? ?Patient Location: PACU ? ?Anesthesia Type:General ? ?Level of Consciousness: awake, alert  and oriented ? ?Airway & Oxygen Therapy: Patient Spontanous Breathing ? ?Post-op Assessment: Report given to RN and Post -op Vital signs reviewed and stable ? ?Post vital signs: Reviewed and stable ? ?Last Vitals:  ?Vitals Value Taken Time  ?BP 93/54 07/28/21 0841  ?Temp 35.7 ?C 07/28/21 0841  ?Pulse 64 07/28/21 0841  ?Resp 22 07/28/21 0841  ?SpO2 96 % 07/28/21 0841  ? ? ?Last Pain:  ?Vitals:  ? 07/28/21 0841  ?TempSrc: Temporal  ?PainSc:   ?   ? ?  ? ?Complications: No notable events documented. ?

## 2021-07-28 NOTE — Anesthesia Preprocedure Evaluation (Addendum)
Anesthesia Evaluation  ?Patient identified by MRN, date of birth, ID band ?Patient awake ? ? ? ?Reviewed: ?Allergy & Precautions, NPO status , Patient's Chart, lab work & pertinent test results ? ?History of Anesthesia Complications ?Negative for: history of anesthetic complications ? ?Airway ?Mallampati: III ? ?TM Distance: >3 FB ?Neck ROM: Full ? ? ? Dental ? ?(+) Missing,  ?  ?Pulmonary ?neg sleep apnea, neg COPD, Current Smoker and Patient abstained from smoking.,  ?  ?breath sounds clear to auscultation- rhonchi ?(-) wheezing ? ? ? ? ? Cardiovascular ?(-) hypertension(-) Past MI and (-) CHF (-) dysrhythmias (-) Valvular Problems/Murmurs ?Rhythm:Regular Rate:Normal ?- Systolic murmurs and - Diastolic murmurs ? ?  ?Neuro/Psych ?neg Seizures   ? GI/Hepatic ?Neg liver ROS, neg GERD  ,  ?Endo/Other  ?neg diabetes ? Renal/GU ?negative Renal ROS  ? ?  ?Musculoskeletal ? ? Abdominal ?(+) + obese,   ?Peds ? Hematology ?  ?Anesthesia Other Findings ? ? Reproductive/Obstetrics ? ?  ? ? ? ? ? ? ? ? ? ? ? ? ? ?  ?  ? ? ? ? ? ? ? ?Anesthesia Physical ? ?Anesthesia Plan ? ?ASA: II ? ?Anesthesia Plan: General  ? ?Post-op Pain Management:   ? ?Induction: Intravenous ? ?PONV Risk Score and Plan: 0 and Propofol infusion ? ?Airway Management Planned: Natural Airway and Nasal Cannula ? ?Additional Equipment:  ? ?Intra-op Plan:  ? ?Post-operative Plan:  ? ?Informed Consent: I have reviewed the patients History and Physical, chart, labs and discussed the procedure including the risks, benefits and alternatives for the proposed anesthesia with the patient or authorized representative who has indicated his/her understanding and acceptance.  ? ? ? ?Dental advisory given ? ?Plan Discussed with: Anesthesiologist and CRNA ? ?Anesthesia Plan Comments:   ? ? ? ? ? ?Anesthesia Quick Evaluation ? ?

## 2021-07-28 NOTE — H&P (Signed)
?  Cephas Darby, MD ?[address]  ?[address]  ?[place], [place] [address]  ?Main: [phone]  ?Fax: [phone] ?Pager: [phone] ? ?Primary Care Physician:  Olin Hauser, DO ?Primary Gastroenterologist:  Dr. Cephas Darby ? ?Pre-Procedure History & Physical: ?HPI:  Erik Francis is a 41 y.o. male is here for an colonoscopy. ?  ?No past medical history on file. ? ?Past Surgical History:  ?Procedure Laterality Date  ? COLONOSCOPY WITH PROPOFOL N/A 07/19/2020  ? Procedure: COLONOSCOPY WITH PROPOFOL;  Surgeon: Lin Landsman, MD;  Location: [facility] ENDOSCOPY;  Service: Gastroenterology;  Laterality: N/A;  ? COLONOSCOPY WITH PROPOFOL N/A 07/20/2020  ? Procedure: COLONOSCOPY WITH PROPOFOL;  Surgeon: Lin Landsman, MD;  Location: [facility] ENDOSCOPY;  Service: Gastroenterology;  Laterality: N/A;  ? ? ?Prior to Admission medications   ?Medication Sig Start Date End Date Taking? Authorizing Provider  ?fluticasone (FLONASE) 50 MCG/ACT nasal spray Place 2 sprays into both nostrils daily. 08/18/15 05/26/19  Hagler, Jami L, PA-C  ? ? ?Allergies as of 06/05/2021  ? (No Known Allergies)  ? ? ?Family History  ?Problem Relation Age of Onset  ? Colon polyps Mother   ? Colon polyps Father   ? Colon polyps Sister 82  ? Colon cancer Maternal Grandfather 98  ?     passed 42  ? ? ?Social History  ? ?Socioeconomic History  ? Marital status: Single  ?  Spouse name: Not on file  ? Number of children: Not on file  ? Years of education: Not on file  ? Highest education level: Not on file  ?Occupational History  ? Not on file  ?Tobacco Use  ? Smoking status: Every Day  ?  Packs/day: 1.00  ?  Years: 18.00  ?  Pack years: 18.00  ?  Types: E-cigarettes, Cigarettes  ? Smokeless tobacco: Never  ?Substance and Sexual Activity  ? Alcohol use: No  ? Drug use: No  ? Sexual activity: Not on file  ?Other Topics Concern  ? Not on file  ?Social History Narrative  ? Not on file  ? ?Social Determinants of Health  ? ?Financial Resource  Strain: Not on file  ?Food Insecurity: Not on file  ?Transportation Needs: Not on file  ?Physical Activity: Not on file  ?Stress: Not on file  ?Social Connections: Not on file  ?Intimate Partner Violence: Not on file  ? ? ?Review of Systems: ?See HPI, otherwise negative ROS ? ?Physical Exam: ?BP (!) 128/95   Pulse 86   Temp (!) 97.1 ?F (36.2 ?C) (Temporal)   Resp 20   Ht '6\' 1"'$  (1.854 m)   Wt 111.1 kg   SpO2 98%   BMI 32.32 kg/m?  ?General:   Alert,  pleasant and cooperative in NAD ?Head:  Normocephalic and atraumatic. ?Neck:  Supple; no masses or thyromegaly. ?Lungs:  Clear throughout to auscultation.    ?Heart:  Regular rate and rhythm. ?Abdomen:  Soft, nontender and nondistended. Normal bowel sounds, without guarding, and without rebound.   ?Neurologic:  Alert and  oriented x4;  grossly normal neurologically. ? ?Impression/Plan: ?Erik Francis is here for an colonoscopy to be performed for h/o colon adenomas ? ?Risks, benefits, limitations, and alternatives regarding  colonoscopy have been reviewed with the patient.  Questions have been answered.  All parties agreeable. ? ? ?Sherri Sear, MD  07/28/2021, 7:38 AM ?

## 2021-08-13 ENCOUNTER — Emergency Department: Payer: Self-pay

## 2021-08-13 ENCOUNTER — Emergency Department
Admission: EM | Admit: 2021-08-13 | Discharge: 2021-08-14 | Disposition: A | Discharge status: Home / Self Care | Payer: Self-pay | Attending: Emergency Medicine | Admitting: Emergency Medicine

## 2021-08-13 ENCOUNTER — Other Ambulatory Visit: Payer: Self-pay

## 2021-08-13 DIAGNOSIS — R079 Chest pain, unspecified: Secondary | ICD-10-CM | POA: Insufficient documentation

## 2021-08-13 DIAGNOSIS — Z5321 Procedure and treatment not carried out due to patient leaving prior to being seen by health care provider: Secondary | ICD-10-CM | POA: Insufficient documentation

## 2021-08-13 LAB — CBC
HCT: 47.2 % (ref 39.0–52.0)
Hemoglobin: 16 g/dL (ref 13.0–17.0)
MCH: 29 pg (ref 26.0–34.0)
MCHC: 33.9 g/dL (ref 30.0–36.0)
MCV: 85.7 fL (ref 80.0–100.0)
Platelets: 209 10*3/uL (ref 150–400)
RBC: 5.51 MIL/uL (ref 4.22–5.81)
RDW: 13.3 % (ref 11.5–15.5)
WBC: 11.7 10*3/uL — ABNORMAL HIGH (ref 4.0–10.5)
nRBC: 0 % (ref 0.0–0.2)

## 2021-08-13 LAB — BASIC METABOLIC PANEL
Anion gap: 11 (ref 5–15)
BUN: 23 mg/dL — ABNORMAL HIGH (ref 6–20)
CO2: 24 mmol/L (ref 22–32)
Calcium: 9.5 mg/dL (ref 8.9–10.3)
Chloride: 103 mmol/L (ref 98–111)
Creatinine, Ser: 1.05 mg/dL (ref 0.61–1.24)
GFR, Estimated: >60 mL/min (ref >60)
Glucose, Bld: 126 mg/dL — ABNORMAL HIGH (ref 70–99)
Potassium: 3.6 mmol/L (ref 3.5–5.1)
Sodium: 138 mmol/L (ref 135–145)

## 2021-08-13 LAB — TROPONIN I (HIGH SENSITIVITY): Troponin I (High Sensitivity): 4 ng/L (ref <18)

## 2021-08-13 NOTE — ED Notes (Signed)
Called pt x2 to reassess vital signs and obtain repeat troponin, pt not visualized in lobby, no answer when called.  ?

## 2021-08-13 NOTE — ED Notes (Signed)
Called pt again for repeat troponin and repeat vital signs, pt not visualized in lobby and no answer when called.  ?

## 2021-08-13 NOTE — ED Triage Notes (Signed)
Left side chest pain, radiating to left shoulder and arm x 70mns. Pt states pain has somewhat resolved now. No exacerbating or alleviating factors. Denies injury ?

## 2021-08-13 NOTE — ED Notes (Signed)
Called pt again for repeat vital signs and to obtain repeat troponin, pt not visualized in lobby and pt does not answer . ?

## 2021-08-15 ENCOUNTER — Encounter: Payer: Self-pay | Admitting: Family Medicine

## 2021-11-05 ENCOUNTER — Encounter: Payer: Self-pay | Admitting: Emergency Medicine

## 2021-11-05 ENCOUNTER — Other Ambulatory Visit: Payer: Self-pay

## 2021-11-05 ENCOUNTER — Emergency Department: Payer: Self-pay

## 2021-11-05 ENCOUNTER — Emergency Department
Admission: EM | Admit: 2021-11-05 | Discharge: 2021-11-05 | Disposition: A | Discharge status: Home / Self Care | Payer: Self-pay | Attending: Emergency Medicine | Admitting: Emergency Medicine

## 2021-11-05 DIAGNOSIS — Z87891 Personal history of nicotine dependence: Secondary | ICD-10-CM | POA: Insufficient documentation

## 2021-11-05 DIAGNOSIS — Z20822 Contact with and (suspected) exposure to covid-19: Secondary | ICD-10-CM | POA: Insufficient documentation

## 2021-11-05 DIAGNOSIS — J069 Acute upper respiratory infection, unspecified: Secondary | ICD-10-CM | POA: Insufficient documentation

## 2021-11-05 DIAGNOSIS — J9801 Acute bronchospasm: Secondary | ICD-10-CM | POA: Insufficient documentation

## 2021-11-05 LAB — SARS CORONAVIRUS 2 BY RT PCR: SARS Coronavirus 2 by RT PCR: NEGATIVE

## 2021-11-05 MED ORDER — PSEUDOEPH-BROMPHEN-DM 30-2-10 MG/5ML PO SYRP: 5.0000 mL | ORAL_SOLUTION | Freq: Four times a day (QID) | ORAL | 0 refills | Status: AC | PRN

## 2021-11-05 MED ORDER — PREDNISONE 10 MG PO TABS: ORAL_TABLET | ORAL | 0 refills | Status: AC

## 2021-11-05 MED ORDER — ALBUTEROL SULFATE HFA 108 (90 BASE) MCG/ACT IN AERS: 2.0000 | INHALATION_SPRAY | Freq: Four times a day (QID) | RESPIRATORY_TRACT | 2 refills | Status: AC | PRN

## 2021-11-05 NOTE — ED Notes (Signed)
See triage note  presents with 1 week hx of nasal  congestion and cough  afebrile on arrival

## 2022-11-07 IMAGING — CT CT RENAL STONE PROTOCOL
2 of 4 series · 16 of 46 positions shown, 18 images · non-contrast
Comparison: 08/21/2016

CLINICAL DATA: Left flank pain radiating to the left groin with
hematuria for 2 days.

EXAM:
CT ABDOMEN AND PELVIS WITHOUT CONTRAST
TECHNIQUE: Multidetector CT imaging of the abdomen and pelvis was performed
following the standard protocol without IV contrast.

[Series 2: stone full standard · axial · 0.78mm/px · z∈[-576,-96]mm · 13 of 106 slices shown, 15 images]
[im 5/106  soft-tissue]
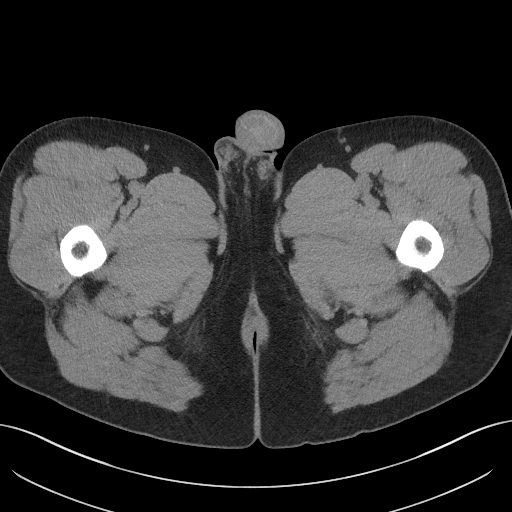
[im 5/106  bone]
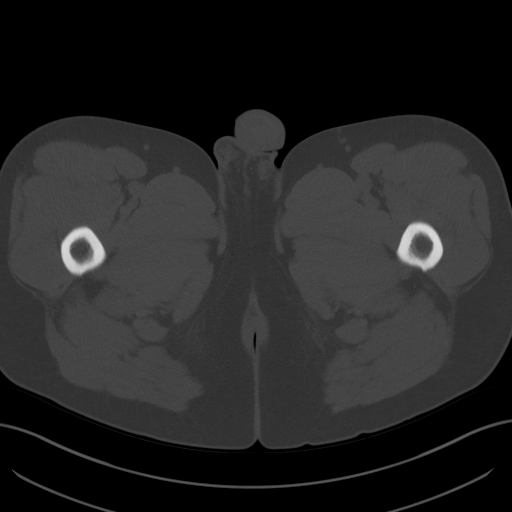
[im 13/106  soft-tissue]
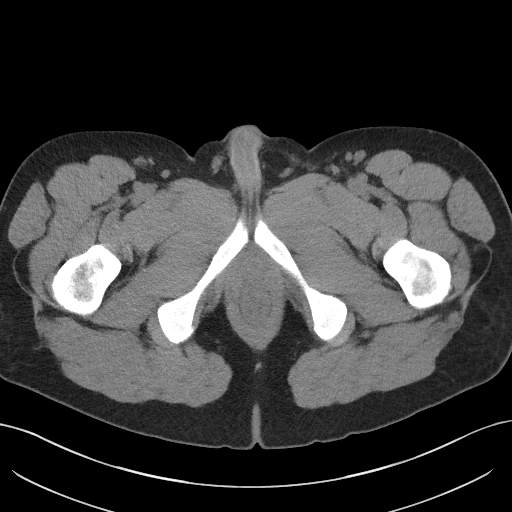
[im 22/106  soft-tissue]
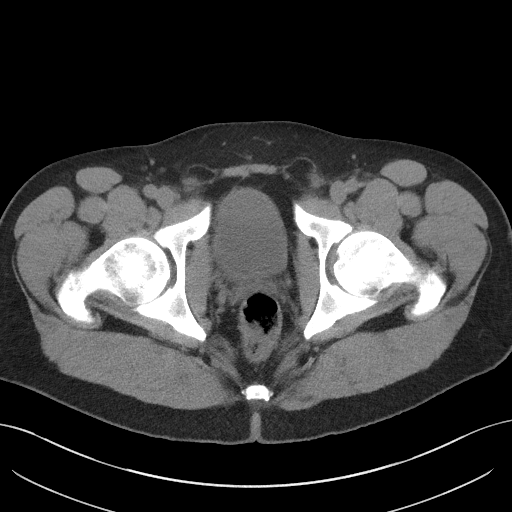
[im 30/106  soft-tissue]
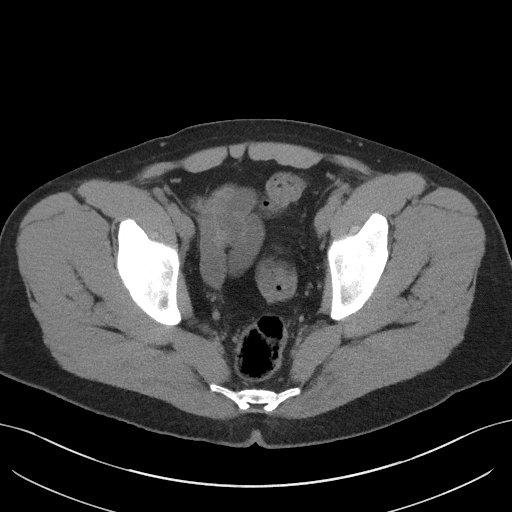
[im 38/106  soft-tissue]
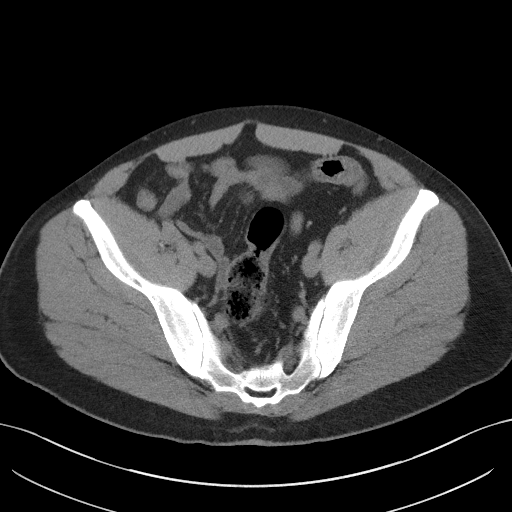
[im 47/106  soft-tissue]
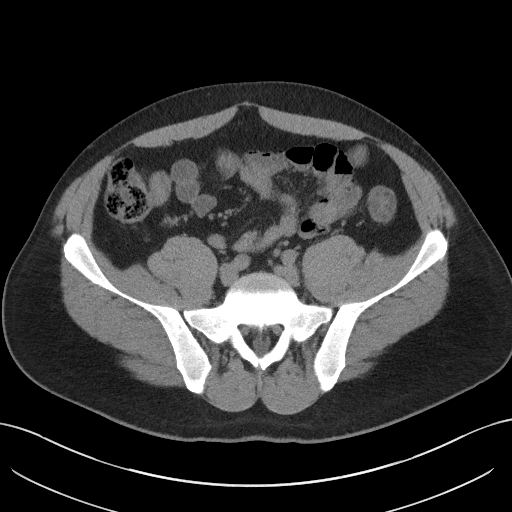
[im 55/106  soft-tissue]
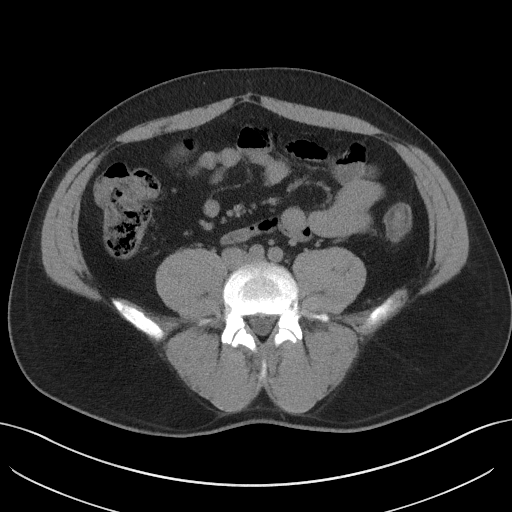
[im 59/106  soft-tissue]
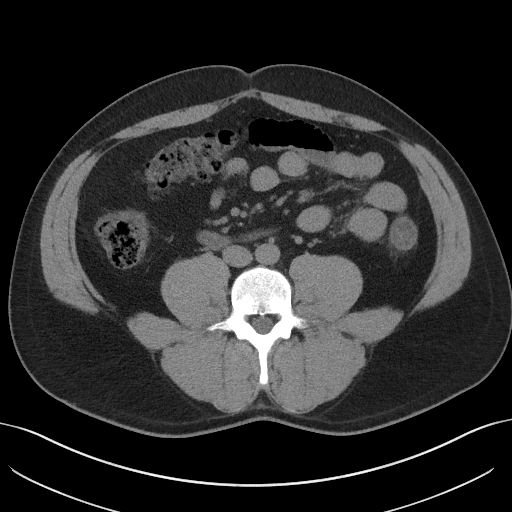
[im 68/106  soft-tissue]
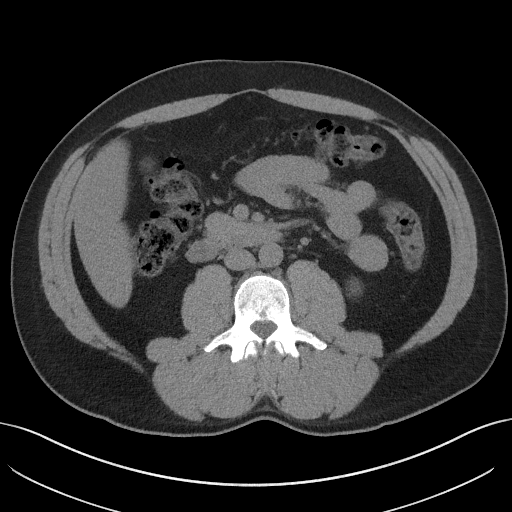
[im 68/106  bone]
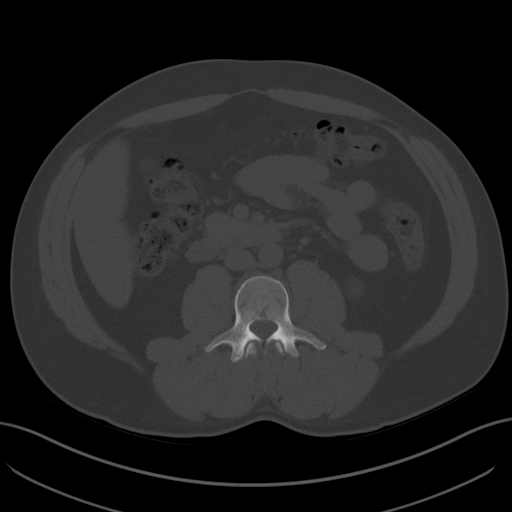
[im 76/106  soft-tissue]
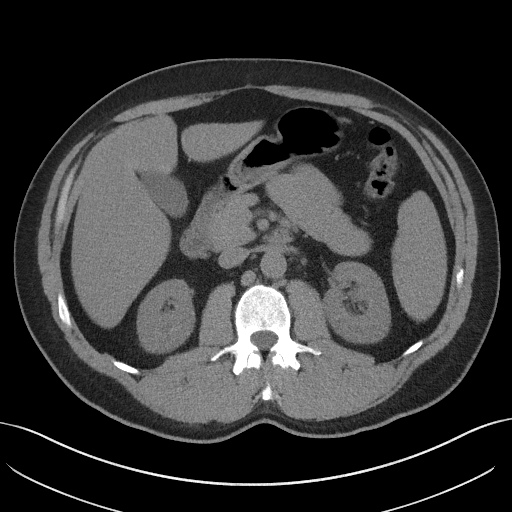
[im 85/106  soft-tissue]
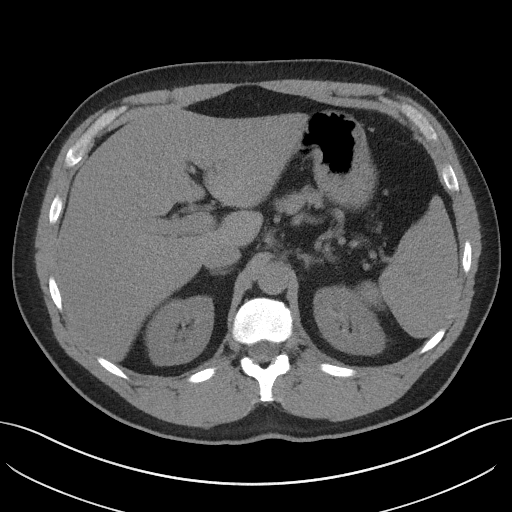
[im 93/106  soft-tissue]
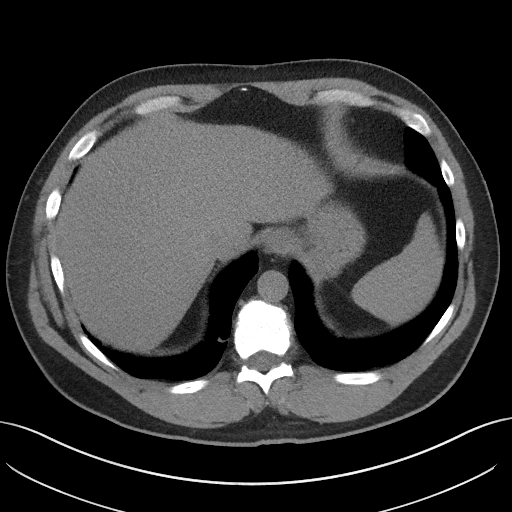
[im 101/106  soft-tissue]
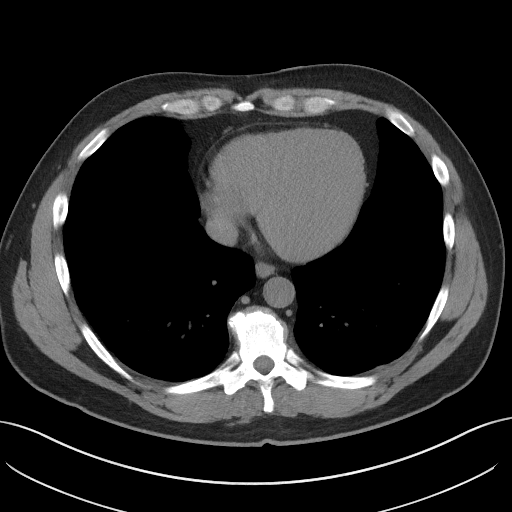

[Series 5: coronal · coronal · 0.80mm/px · 3 of 154 slices shown]
[im 52/154  soft-tissue]
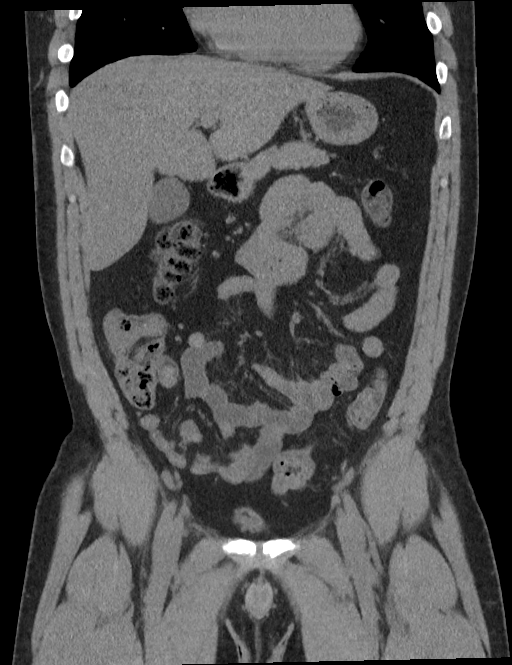
[im 69/154  soft-tissue]
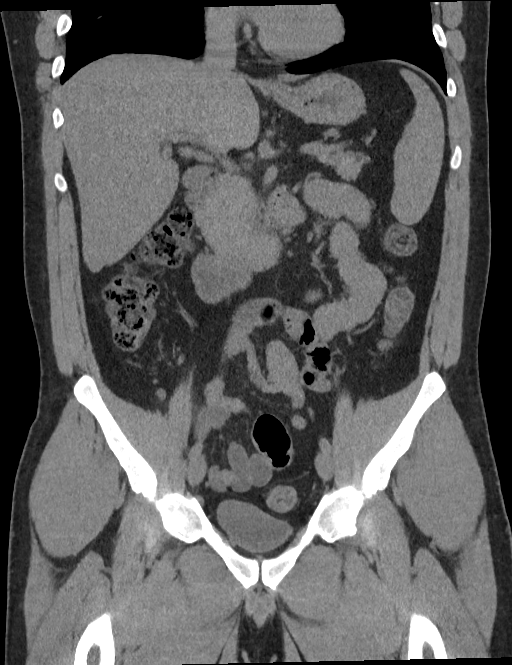
[im 86/154  soft-tissue]
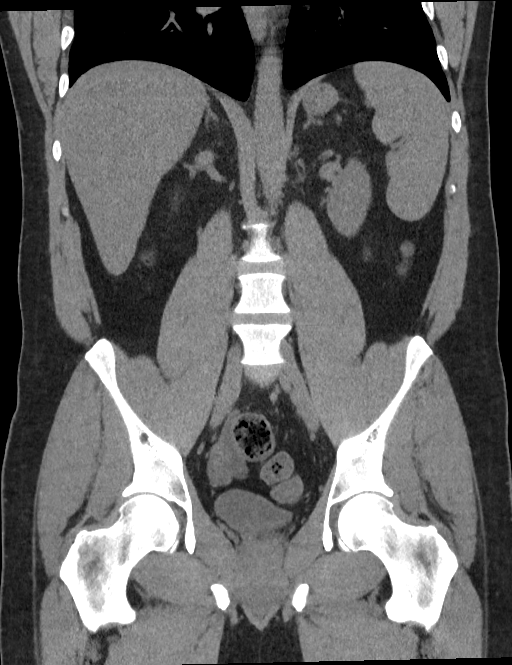

[16 of 46 positions shown; findings below may reference images not displayed]

FINDINGS: Lower chest: No acute abnormality.

Hepatobiliary: Decreased attenuation of the liver consistent with
fatty infiltration. Liver normal in size. No mass or focal lesion.
Normal gallbladder. No bile duct dilation.

Pancreas: Unremarkable. No pancreatic ductal dilatation or
surrounding inflammatory changes.

Spleen: Normal in size without focal abnormality.

Adrenals/Urinary Tract: No adrenal masses.

Kidneys are normal in size, orientation and position. No renal
masses, stones or hydronephrosis. Ureters are normal in course and
in caliber. No ureteral stones. Normal bladder.

Stomach/Bowel: Stomach is within normal limits. Appendix appears
normal. No evidence of bowel wall thickening, distention, or
inflammatory changes.

Vascular/Lymphatic: No significant vascular findings are present. No
enlarged abdominal or pelvic lymph nodes.

Reproductive: Unremarkable.

Other: No abdominal wall hernia or abnormality. No abdominopelvic
ascites.

Musculoskeletal: No fracture or acute finding. Mild disc
degenerative changes of the lower thoracic spine. No bone lesion.
IMPRESSION: 1. No acute findings. No renal or ureteral stones. No signs of
obstruction. No findings to account for the patient's symptoms.
2. Hepatic steatosis.

## 2022-11-15 ENCOUNTER — Emergency Department: Payer: Self-pay

## 2022-11-15 ENCOUNTER — Other Ambulatory Visit: Payer: Self-pay

## 2022-11-15 ENCOUNTER — Emergency Department
Admission: EM | Admit: 2022-11-15 | Discharge: 2022-11-15 | Disposition: A | Discharge status: Home / Self Care | Payer: Self-pay | Attending: Student in an Organized Health Care Education/Training Program | Admitting: Student in an Organized Health Care Education/Training Program

## 2022-11-15 DIAGNOSIS — S46912A Strain of unspecified muscle, fascia and tendon at shoulder and upper arm level, left arm, initial encounter: Secondary | ICD-10-CM | POA: Insufficient documentation

## 2022-11-15 DIAGNOSIS — R0789 Other chest pain: Secondary | ICD-10-CM | POA: Insufficient documentation

## 2022-11-15 DIAGNOSIS — Y990 Civilian activity done for income or pay: Secondary | ICD-10-CM | POA: Insufficient documentation

## 2022-11-15 DIAGNOSIS — F17200 Nicotine dependence, unspecified, uncomplicated: Secondary | ICD-10-CM | POA: Insufficient documentation

## 2022-11-15 DIAGNOSIS — X500XXA Overexertion from strenuous movement or load, initial encounter: Secondary | ICD-10-CM | POA: Insufficient documentation

## 2022-11-15 LAB — CBC
HCT: 48.7 % (ref 39.0–52.0)
Hemoglobin: 16.4 g/dL (ref 13.0–17.0)
MCH: 28.7 pg (ref 26.0–34.0)
MCHC: 33.7 g/dL (ref 30.0–36.0)
MCV: 85.1 fL (ref 80.0–100.0)
Platelets: 185 10*3/uL (ref 150–400)
RBC: 5.72 MIL/uL (ref 4.22–5.81)
RDW: 13.2 % (ref 11.5–15.5)
WBC: 8.8 10*3/uL (ref 4.0–10.5)
nRBC: 0.2 % (ref 0.0–0.2)

## 2022-11-15 LAB — COMPREHENSIVE METABOLIC PANEL
ALT: 30 U/L (ref 0–44)
AST: 24 U/L (ref 15–41)
Albumin: 4.1 g/dL (ref 3.5–5.0)
Alkaline Phosphatase: 89 U/L (ref 38–126)
Anion gap: 9 (ref 5–15)
BUN: 18 mg/dL (ref 6–20)
CO2: 24 mmol/L (ref 22–32)
Calcium: 9.3 mg/dL (ref 8.9–10.3)
Chloride: 103 mmol/L (ref 98–111)
Creatinine, Ser: 1.04 mg/dL (ref 0.61–1.24)
GFR, Estimated: >60 mL/min (ref >60)
Glucose, Bld: 107 mg/dL — ABNORMAL HIGH (ref 70–99)
Potassium: 4 mmol/L (ref 3.5–5.1)
Sodium: 136 mmol/L (ref 135–145)
Total Bilirubin: 0.5 mg/dL (ref 0.3–1.2)
Total Protein: 7.4 g/dL (ref 6.5–8.1)

## 2022-11-15 LAB — TROPONIN I (HIGH SENSITIVITY): Troponin I (High Sensitivity): 2 ng/L (ref <18)

## 2022-11-15 MED ORDER — MELOXICAM 15 MG PO TABS: 15.0000 mg | ORAL_TABLET | Freq: Every day | ORAL | 0 refills | Status: AC

## 2022-11-15 MED ORDER — BACLOFEN 10 MG PO TABS: 10.0000 mg | ORAL_TABLET | Freq: Three times a day (TID) | ORAL | 0 refills | Status: AC

## 2022-11-15 NOTE — ED Notes (Signed)
See triage notes. Patient stated that he has had this pain for a while but it has gotten "worser" lately. Stated it feels like a pulling under his breast.

## 2022-11-15 NOTE — ED Triage Notes (Signed)
Pt presents to ER with c/o left side chest pain that has been going on for appx 2 weeks, but states it woke him up tonight around 0200, and has been relatively constant since then.  Pt states pain is worsened with certain movement and on palpation.  Pt endorses some dizziness with the chest pain, but denies any SOB.  Denies any hx of heart disease.  Pt is otherwise A&O x4 and in NAD.

## 2022-11-15 NOTE — ED Provider Notes (Signed)
[facility] Provider Note    Event Date/Time   First MD Initiated Contact with Patient 11/15/22 [phone]     (approximate)   History   Chest Pain   HPI  Erik Francis is a 42 y.o. male smoker with left shoulder/chest pain.  Patient states that he does do a lot of lifting at work.  States pain is more when he rolls over onto his left side.  Occasional shortness of breath but nothing regularly.  No fever or chills.  No difficulty breathing when he lies back.  No pain in his legs.  Patient states he got on [employer] and got concerned.  Chest pain has been ongoing for about 2 weeks      Physical Exam   Triage Vital Signs: ED Triage Vitals  Enc Vitals Group     BP 11/15/22 0622 (!) 123/90     Pulse Rate 11/15/22 0622 71     Resp 11/15/22 0622 17     Temp 11/15/22 0622 98.1 F (36.7 C)     Temp Source 11/15/22 0622 Oral     SpO2 11/15/22 0622 100 %     Weight 11/15/22 0623 250 lb (113.4 kg)     Height 11/15/22 0623 6\' 1"  (1.854 m)     Head Circumference --      Peak Flow --      Pain Score 11/15/22 0622 6     Pain Loc --      Pain Edu? --      Excl. in GC? --     Most recent vital signs: Vitals:   11/15/22 0622  BP: (!) 123/90  Pulse: 71  Resp: 17  Temp: 98.1 F (36.7 C)  SpO2: 100%     General: Awake, no distress.   CV:  Good peripheral perfusion. regular rate and  rhythm Resp:  Normal effort. Lungs cta Abd:  No distention.   Other:  Pain is reproduced with movement of the left shoulder, area is slightly tender to palpation, grip is equal bilaterally, neurovascular intact, no swelling in lower extremities   ED Results / Procedures / Treatments   Labs (all labs ordered are listed, but only abnormal results are displayed) Labs Reviewed  COMPREHENSIVE METABOLIC PANEL - Abnormal; Notable for the following components:      Result Value   Glucose, Bld 107 (*)    All other components within normal limits  CBC  TROPONIN I (HIGH SENSITIVITY)   TROPONIN I (HIGH SENSITIVITY)     EKG  EKG   RADIOLOGY Chest x-ray    PROCEDURES:   Procedures   MEDICATIONS ORDERED IN ED: Medications - No data to display   IMPRESSION / MDM / ASSESSMENT AND PLAN / ED COURSE  I reviewed the triage vital signs and the nursing notes.                              Differential diagnosis includes, but is not limited to, MI, angina, shoulder strain, rotator cuff strain, chest wall pain  Patient's presentation is most consistent with acute presentation with potential threat to life or bodily function.   Labs for ACS were obtained in triage  Labs are reassuring  Chest x-ray independently reviewed interpreted by me as being negative for any acute abnormality   EKG shows normal sinus rhythm, when compared with the EKG from 2023 there is no significant change.  See physician read  I did explain all the findings to the patient.  Feel this is more of a muscle strain/shoulder strain.  Will do a trial of meloxicam and baclofen.  He is to follow-up with his regular doctor.  Did discuss smoking cessation as he can develop cardiac disease by smoking.  Patient is in agreement treatment plan.  He was discharged stable condition.  He is to follow-up with orthopedics if not improving.  Return if worsening chest pain   FINAL CLINICAL IMPRESSION(S) / ED DIAGNOSES   Final diagnoses:  Chest wall pain  Strain of left shoulder, initial encounter     Rx / DC Orders   ED Discharge Orders          Ordered    meloxicam (MOBIC) 15 MG tablet  Daily        11/15/22 0759    baclofen (LIORESAL) 10 MG tablet  3 times daily        11/15/22 0759             Note:  This document was prepared using Dragon voice recognition software and may include unintentional dictation errors.    Faythe Ghee, PA-C 11/15/22 1610    Willy Eddy, MD 11/15/22 [phone]

## 2022-11-20 ENCOUNTER — Telehealth: Payer: Self-pay | Admitting: *Deleted

## 2022-11-20 NOTE — Transitions of Care (Post Inpatient/ED Visit) (Signed)
   11/20/2022  Name: Erik Francis MRN: [id number] DOB: February 03, 1981  Today's TOC FU Call Status: Today's TOC FU Call Status:: Unsuccessul Call (1st Attempt) Unsuccessful Call (1st Attempt) Date: 11/20/22  Attempted to reach the patient regarding the most recent Inpatient/ED visit.  Follow Up Plan: Additional outreach attempts will be made to reach the patient to complete the Transitions of Care (Post Inpatient/ED visit) call.   Gean Maidens BSN RN [facility] Care Management [phone]

## 2022-11-21 ENCOUNTER — Encounter: Payer: Self-pay | Admitting: *Deleted

## 2022-11-21 ENCOUNTER — Telehealth: Payer: Self-pay | Admitting: *Deleted

## 2022-11-21 NOTE — Transitions of Care (Post Inpatient/ED Visit) (Signed)
   11/21/2022  Name: Erik Francis MRN: [id number] DOB: June 20, 1980  Today's TOC FU Call Status: Today's TOC FU Call Status:: Unsuccessful Call (2nd Attempt) Unsuccessful Call (2nd Attempt) Date: 11/21/22  ED EMMI Red Alert notification from ED visit 11/15/22- automated EMMI call placed 11/16/22: "No scheduled follow up"  Attempted to reach the patient regarding the most recent ED visit; left HIPAA compliant voice message requesting call back  Follow Up Plan: Additional outreach attempts will be made to reach the patient to complete the Transitions of Care (Post ED visit) call.   Caryl Pina, RN, BSN, CCRN Alumnus RN CM Care Coordination/ Transition of Care- [facility] Care Management [phone]: direct office

## 2022-11-22 ENCOUNTER — Telehealth: Payer: Self-pay | Admitting: *Deleted

## 2022-11-22 ENCOUNTER — Encounter: Payer: Self-pay | Admitting: *Deleted

## 2022-11-22 NOTE — Transitions of Care (Post Inpatient/ED Visit) (Signed)
   11/22/2022  Name: YUSSEF JORGE MRN: [id number] DOB: 1980/07/05  Today's TOC FU Call Status: Today's TOC FU Call Status:: Unsuccessful Call (3rd Attempt) Unsuccessful Call (3rd Attempt) Date: 11/22/22  ED EMMI Red Alert notification from ED visit 11/15/22- automated EMMI call placed 11/16/22: "No scheduled follow up"  Attempted to reach the patient regarding the most recent ED visit; left HIPAA compliant voice message requesting call back  Follow Up Plan: No further outreach attempts will be made at this time. We have been unable to contact the patient.  Caryl Pina, RN, BSN, CCRN Alumnus RN CM Care Coordination/ Transition of Care- [facility] Care Management [phone]: direct office

## 2023-03-08 ENCOUNTER — Emergency Department
Admission: EM | Admit: 2023-03-08 | Discharge: 2023-03-09 | Disposition: A | Discharge status: Home / Self Care | Payer: Self-pay | Attending: Student in an Organized Health Care Education/Training Program | Admitting: Student in an Organized Health Care Education/Training Program

## 2023-03-08 DIAGNOSIS — R9431 Abnormal electrocardiogram [ECG] [EKG]: Secondary | ICD-10-CM | POA: Diagnosis not present

## 2023-03-08 DIAGNOSIS — T40725A Adverse effect of synthetic cannabinoids, initial encounter: Secondary | ICD-10-CM

## 2023-03-08 DIAGNOSIS — R112 Nausea with vomiting, unspecified: Secondary | ICD-10-CM | POA: Diagnosis not present

## 2023-03-08 DIAGNOSIS — R4182 Altered mental status, unspecified: Secondary | ICD-10-CM | POA: Insufficient documentation

## 2023-03-08 LAB — COMPREHENSIVE METABOLIC PANEL
ALT: 35 U/L (ref 0–44)
AST: 32 U/L (ref 15–41)
Albumin: 4.4 g/dL (ref 3.5–5.0)
Alkaline Phosphatase: 74 U/L (ref 38–126)
Anion gap: 11 (ref 5–15)
BUN: 19 mg/dL (ref 6–20)
CO2: 24 mmol/L (ref 22–32)
Calcium: 8.9 mg/dL (ref 8.9–10.3)
Chloride: 104 mmol/L (ref 98–111)
Creatinine, Ser: 1.25 mg/dL — ABNORMAL HIGH (ref 0.61–1.24)
GFR, Estimated: >60 mL/min (ref >60)
Glucose, Bld: 149 mg/dL — ABNORMAL HIGH (ref 70–99)
Potassium: 3.4 mmol/L — ABNORMAL LOW (ref 3.5–5.1)
Sodium: 139 mmol/L (ref 135–145)
Total Bilirubin: 0.8 mg/dL (ref 0.3–1.2)
Total Protein: 7.5 g/dL (ref 6.5–8.1)

## 2023-03-08 LAB — CBC WITH DIFFERENTIAL/PLATELET
Abs Immature Granulocytes: 0.17 10*3/uL — ABNORMAL HIGH (ref 0.00–0.07)
Basophils Absolute: 0.1 10*3/uL (ref 0.0–0.1)
Basophils Relative: 1 %
Eosinophils Absolute: 0.4 10*3/uL (ref 0.0–0.5)
Eosinophils Relative: 3 %
HCT: 42.3 % (ref 39.0–52.0)
Hemoglobin: 14.6 g/dL (ref 13.0–17.0)
Immature Granulocytes: 1 %
Lymphocytes Relative: 33 %
Lymphs Abs: 5.2 10*3/uL — ABNORMAL HIGH (ref 0.7–4.0)
MCH: 29.3 pg (ref 26.0–34.0)
MCHC: 34.5 g/dL (ref 30.0–36.0)
MCV: 84.8 fL (ref 80.0–100.0)
Monocytes Absolute: 0.8 10*3/uL (ref 0.1–1.0)
Monocytes Relative: 5 %
Neutro Abs: 9 10*3/uL — ABNORMAL HIGH (ref 1.7–7.7)
Neutrophils Relative %: 57 %
Platelets: 230 10*3/uL (ref 150–400)
RBC: 4.99 MIL/uL (ref 4.22–5.81)
RDW: 13.4 % (ref 11.5–15.5)
WBC: 15.7 10*3/uL — ABNORMAL HIGH (ref 4.0–10.5)
nRBC: 0 % (ref 0.0–0.2)

## 2023-03-08 LAB — SALICYLATE LEVEL: Salicylate Lvl: <7 mg/dL — ABNORMAL LOW (ref 7.0–30.0)

## 2023-03-08 LAB — ETHANOL: Alcohol, Ethyl (B): <10 mg/dL (ref <10)

## 2023-03-08 LAB — ACETAMINOPHEN LEVEL: Acetaminophen (Tylenol), Serum: <10 ug/mL — ABNORMAL LOW (ref 10–30)

## 2023-03-08 MED ORDER — LORAZEPAM 2 MG/ML IJ SOLN
1.0000 mg | Freq: Once | INTRAMUSCULAR | Status: AC
  Administered 2023-03-08: 1 mg via INTRAVENOUS
  Filled 2023-03-08: qty 1

## 2023-03-08 MED ORDER — SODIUM CHLORIDE 0.9 % IV BOLUS
1000.0000 mL | Freq: Once | INTRAVENOUS | Status: AC
  Administered 2023-03-08: 1000 mL via INTRAVENOUS

## 2023-03-08 MED ORDER — ONDANSETRON HCL 4 MG/2ML IJ SOLN
4.0000 mg | Freq: Once | INTRAMUSCULAR | Status: AC
  Administered 2023-03-08: 4 mg via INTRAVENOUS
  Filled 2023-03-08: qty 2

## 2023-03-08 NOTE — ED Triage Notes (Signed)
Patient BIB [facility] from home after ingesting a Delta 8 gummy about an hour ago. Patient reports " I recently lost my dad ."

## 2023-03-08 NOTE — ED Provider Notes (Signed)
-----------------------------------------   11:05 PM on 03/08/2023 -----------------------------------------  Assuming care from Dr. Roxan Hockey.  In short, Erik Francis is a 42 y.o. male with a chief complaint of vomiting after Delta 8 gummy ingestion.  Refer to the original H&P for additional details.  The current plan of care is to reassess for clinical sobriety and perform another SI screening.   Clinical Course as of 03/09/23 0148  Caleen Essex Mar 08, 2023  2122 Blood work is reassuring.  Patient will be signed out to oncoming physician pending further observation until the patient metabolizes and can be reassessed. [PR]  Sat Mar 09, 2023  0145 Patient woke up easily to soft voice.  He said he remembers everything that happens but he feels much better now.  He said that even though he is sad about his dad, he would never kill himself and is having no thoughts of suicidal ideation or any thoughts of hurting anyone else.  He said he would like to go home and get some rest which seems very appropriate.  He is able to ambulate without assistance and is clinically sober.  He is getting a sober adult to come pick him up and take him home.  I gave my usual return precautions and follow-up recommendations. [CF]    Clinical Course User Index [CF] Loleta Rose, MD [PR] Willy Eddy, MD     Medications  ondansetron [facility]) injection 4 mg (4 mg Intravenous Given 03/08/23 2000)  LORazepam (ATIVAN) injection 1 mg (1 mg Intravenous Given 03/08/23 2000)  sodium chloride 0.9 % bolus 1,000 mL (0 mLs Intravenous Stopped 03/08/23 2256)     ED Discharge Orders     None      Final diagnoses:  Nausea and vomiting, unspecified vomiting type  Adverse effect of synthetic cannabinoid, initial encounter     Loleta Rose, MD 03/09/23 6213

## 2023-03-08 NOTE — ED Provider Notes (Signed)
[facility] ([facility]) Provider Note    Event Date/Time   First MD Initiated Contact with Patient 03/08/23 1943     (approximate)   History   Drug Problem   HPI  Erik Francis is a 42 y.o. male with a history of tobacco abuse recent loss of his father and symptoms of depression presents to the ER for nausea vomiting anxiety and feeling depression and altered mental status after taking a full gummy of delta 8.  Uncertain as to total dose.  This is the first time he is ever taken that medication.  Does not specify whether this was intentional or accidental.  States he was trying to get high.  Denies any intent for self-harm.     Physical Exam   Triage Vital Signs: ED Triage Vitals  Encounter Vitals Group     BP      Systolic BP Percentile      Diastolic BP Percentile      Pulse      Resp      Temp      Temp src      SpO2      Weight      Height      Head Circumference      Peak Flow      Pain Score      Pain Loc      Pain Education      Exclude from Growth Chart     Most recent vital signs: Vitals:   03/08/23 1952  BP: (!) 142/94  Pulse: (!) 111  Resp: 17  Temp: 97.8 F (36.6 C)  SpO2: 93%     Constitutional: Alert, protecting airway, Eyes: Conjunctivae are normal.  Head: Atraumatic. Nose: No congestion/rhinnorhea. Mouth/Throat: Mucous membranes are moist.   Neck: Painless ROM.  Cardiovascular:   Good peripheral circulation. Respiratory: Normal respiratory effort.  No retractions.  Gastrointestinal: Soft and nontender.  Musculoskeletal:  no deformity Neurologic:  MAE spontaneously. No gross focal neurologic deficits are appreciated.  Skin:  Skin is warm, dry and intact. No rash noted.     ED Results / Procedures / Treatments   Labs (all labs ordered are listed, but only abnormal results are displayed) Labs Reviewed  ACETAMINOPHEN LEVEL - Abnormal; Notable for the following components:      Result Value   Acetaminophen (Tylenol),  Serum <10 (*)    All other components within normal limits  SALICYLATE LEVEL - Abnormal; Notable for the following components:   Salicylate Lvl <7.0 (*)    All other components within normal limits  COMPREHENSIVE METABOLIC PANEL - Abnormal; Notable for the following components:   Potassium 3.4 (*)    Glucose, Bld 149 (*)    Creatinine, Ser 1.25 (*)    All other components within normal limits  CBC WITH DIFFERENTIAL/PLATELET - Abnormal; Notable for the following components:   WBC 15.7 (*)    Neutro Abs 9.0 (*)    Lymphs Abs 5.2 (*)    Abs Immature Granulocytes 0.17 (*)    All other components within normal limits  ETHANOL     EKG  ED ECG REPORT I, Willy Eddy, the attending physician, personally viewed and interpreted this ECG.   Date: 03/08/2023  EKG Time: 20:59  Rate: 100  Rhythm: sinus  Axis: normal  Intervals: normal qt  ST&T Change: nonspecific st abn    RADIOLOGY Please see ED Course for my review and interpretation.  I personally reviewed all radiographic  images ordered to evaluate for the above acute complaints and reviewed radiology reports and findings.  These findings were personally discussed with the patient.  Please see medical record for radiology report.    PROCEDURES:  Critical Care performed:   Procedures   MEDICATIONS ORDERED IN ED: Medications  ondansetron (ZOFRAN) injection 4 mg (4 mg Intravenous Given 03/08/23 2000)  LORazepam (ATIVAN) injection 1 mg (1 mg Intravenous Given 03/08/23 2000)  sodium chloride 0.9 % bolus 1,000 mL (1,000 mLs Intravenous New Bag/Given 03/08/23 2104)     IMPRESSION / MDM / ASSESSMENT AND PLAN / ED COURSE  I reviewed the triage vital signs and the nursing notes.                              Differential diagnosis includes, but is not limited to, overdose,, intentional, accidental, therapeutic misadventure, electrolyte abnormality, enteritis, depression  Patient presenting to the ER for evaluation of  symptoms as described above.  Based on symptoms, risk factors and considered above differential, this presenting complaint could reflect a potentially life-threatening illness therefore the patient will be placed on continuous pulse oximetry and telemetry for monitoring.  Laboratory evaluation will be sent to evaluate for the above complaints.      Clinical Course as of 03/08/23 2124  Fri Mar 08, 2023  2122 Blood work is reassuring.  Patient will be signed out to oncoming physician pending further observation until the patient metabolizes and can be reassessed. [PR]    Clinical Course User Index [PR] Willy Eddy, MD     FINAL CLINICAL IMPRESSION(S) / ED DIAGNOSES   Final diagnoses:  Nausea and vomiting, unspecified vomiting type     Rx / DC Orders   ED Discharge Orders     None        Note:  This document was prepared using Dragon voice recognition software and may include unintentional dictation errors.    Willy Eddy, MD 03/08/23 2124

## 2023-03-09 MED ORDER — METOCLOPRAMIDE HCL 5 MG/ML IJ SOLN
10.0000 mg | INTRAMUSCULAR | Status: AC
  Administered 2023-03-09: 10 mg via INTRAVENOUS
  Filled 2023-03-09: qty 2

## 2023-03-09 MED ORDER — ONDANSETRON 4 MG PO TBDP: ORAL_TABLET | ORAL | 0 refills | Status: AC

## 2023-03-09 MED ORDER — ONDANSETRON 4 MG PO TBDP
4.0000 mg | ORAL_TABLET | Freq: Once | ORAL | Status: AC
  Administered 2023-03-09: 4 mg via ORAL
  Filled 2023-03-09: qty 1

## 2023-03-09 NOTE — Discharge Instructions (Addendum)
Your workup in the Emergency Department today was reassuring.  We did not find any specific abnormalities.  You almost certainly were feeling ill because of the use of the delta 8 Gummies, which we strongly recommend that you avoid in the future.  We recommend you drink plenty of fluids, take your regular medications and/or any new ones prescribed today, and follow up with the doctor(s) listed in these documents as recommended.  Return to the Emergency Department if you develop new or worsening symptoms that concern you.

## 2023-04-09 ENCOUNTER — Emergency Department: Payer: 59

## 2023-04-09 ENCOUNTER — Emergency Department
Admission: EM | Admit: 2023-04-09 | Discharge: 2023-04-09 | Disposition: A | Discharge status: Home / Self Care | Payer: 59 | Attending: Emergency Medicine | Admitting: Emergency Medicine

## 2023-04-09 ENCOUNTER — Other Ambulatory Visit: Payer: Self-pay

## 2023-04-09 ENCOUNTER — Encounter: Payer: Self-pay | Admitting: Emergency Medicine

## 2023-04-09 DIAGNOSIS — J40 Bronchitis, not specified as acute or chronic: Secondary | ICD-10-CM | POA: Diagnosis not present

## 2023-04-09 DIAGNOSIS — R040 Epistaxis: Secondary | ICD-10-CM | POA: Insufficient documentation

## 2023-04-09 DIAGNOSIS — R053 Chronic cough: Secondary | ICD-10-CM | POA: Diagnosis not present

## 2023-04-09 DIAGNOSIS — Z8611 Personal history of tuberculosis: Secondary | ICD-10-CM | POA: Insufficient documentation

## 2023-04-09 DIAGNOSIS — R0981 Nasal congestion: Secondary | ICD-10-CM | POA: Diagnosis not present

## 2023-04-09 DIAGNOSIS — R042 Hemoptysis: Secondary | ICD-10-CM

## 2023-04-09 DIAGNOSIS — A159 Respiratory tuberculosis unspecified: Secondary | ICD-10-CM | POA: Diagnosis not present

## 2023-04-09 DIAGNOSIS — J069 Acute upper respiratory infection, unspecified: Secondary | ICD-10-CM | POA: Diagnosis not present

## 2023-04-09 DIAGNOSIS — J029 Acute pharyngitis, unspecified: Secondary | ICD-10-CM | POA: Diagnosis not present

## 2023-04-09 DIAGNOSIS — R059 Cough, unspecified: Secondary | ICD-10-CM | POA: Diagnosis not present

## 2023-04-09 LAB — CBC
HCT: 43.6 % (ref 39.0–52.0)
Hemoglobin: 15.1 g/dL (ref 13.0–17.0)
MCH: 29.3 pg (ref 26.0–34.0)
MCHC: 34.6 g/dL (ref 30.0–36.0)
MCV: 84.5 fL (ref 80.0–100.0)
Platelets: 189 10*3/uL (ref 150–400)
RBC: 5.16 MIL/uL (ref 4.22–5.81)
RDW: 13.3 % (ref 11.5–15.5)
WBC: 10.6 10*3/uL — ABNORMAL HIGH (ref 4.0–10.5)
nRBC: 0 % (ref 0.0–0.2)

## 2023-04-09 LAB — BASIC METABOLIC PANEL
Anion gap: 9 (ref 5–15)
BUN: 20 mg/dL (ref 6–20)
CO2: 25 mmol/L (ref 22–32)
Calcium: 9.2 mg/dL (ref 8.9–10.3)
Chloride: 102 mmol/L (ref 98–111)
Creatinine, Ser: 1.05 mg/dL (ref 0.61–1.24)
GFR, Estimated: >60 mL/min (ref >60)
Glucose, Bld: 112 mg/dL — ABNORMAL HIGH (ref 70–99)
Potassium: 3.7 mmol/L (ref 3.5–5.1)
Sodium: 136 mmol/L (ref 135–145)

## 2023-04-09 MED ORDER — IOHEXOL 300 MG/ML  SOLN
75.0000 mL | Freq: Once | INTRAMUSCULAR | Status: AC | PRN
  Administered 2023-04-09: 75 mL via INTRAVENOUS

## 2023-04-09 NOTE — ED Provider Notes (Signed)
[facility] Provider Note   Event Date/Time   First MD Initiated Contact with Patient 04/09/23 1744     (approximate) History  Hemoptysis  HPI Erik Francis is a 42 y.o. 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  Patient shows pictures of small clots that he has been coughing up in the setting of worsening sore throat, dry nose and extremely dry conditions at home.  Patient is concerned given his history of tuberculosis however states that this does feel like an upper respiratory infection. ROS: Patient currently denies any vision changes, tinnitus, difficulty speaking, facial droop, chest pain, shortness of breath, abdominal pain, nausea/vomiting/diarrhea, dysuria, or weakness/numbness/paresthesias in any extremity   Physical Exam  Triage Vital Signs: ED Triage Vitals  Encounter Vitals Group     BP 04/09/23 1608 (!) 157/119     Systolic BP Percentile --      Diastolic BP Percentile --      Pulse Rate 04/09/23 1608 72     Resp 04/09/23 1608 18     Temp 04/09/23 1608 98.4 F (36.9 C)     Temp Source 04/09/23 1608 Oral     SpO2 04/09/23 1608 97 %     Weight 04/09/23 1607 210 lb (95.3 kg)     Height 04/09/23 1607 6\' 1"  (1.854 m)     Head Circumference --      Peak Flow --      Pain Score 04/09/23 1607 0     Pain Loc --      Pain Education --      Exclude from Growth Chart --    Most recent vital signs: Vitals:   04/09/23 1608 04/09/23 1927  BP: (!) 157/119 132/84  Pulse: 72 70  Resp: 18 16  Temp: 98.4 F (36.9 C) 98.6 F (37 C)  SpO2: 97% 99%   General: Awake, oriented x4. CV:  Good peripheral perfusion.  Resp:  Normal effort.  Abd:  No distention.  Other:  Middle-aged overweight Native American male resting comfortably in no acute distress ED Results / Procedures / Treatments  Labs (all labs ordered are listed, but only abnormal results are displayed) Labs Reviewed   CBC - Abnormal; Notable for the following components:      Result Value   WBC 10.6 (*)    All other components within normal limits  BASIC METABOLIC PANEL - Abnormal; Notable for the following components:   Glucose, Bld 112 (*)    All other components within normal limits   RADIOLOGY ED MD interpretation: 2 view chest x-ray interpreted by me shows no evidence of acute abnormalities including no pneumonia, pneumothorax, or widened mediastinum CT with contrast of the chest interpreted independently and shows no evidence of acute abnormalities -Agree with radiology assessment Official radiology report(s): CT Chest W Contrast  Result Date: 04/09/2023 CLINICAL DATA:  Hemoptysis, chronic cough, tobacco abuse, personal history of tuberculosis EXAM: CT CHEST WITH CONTRAST TECHNIQUE: Multidetector CT imaging of the chest was performed during intravenous contrast administration. RADIATION DOSE REDUCTION: This exam was performed according to the departmental dose-optimization program which includes automated exposure control, adjustment of the mA and/or kV according to patient size and/or use of iterative reconstruction technique. CONTRAST:  75mL OMNIPAQUE IOHEXOL 300 MG/ML  SOLN COMPARISON:  06/15/2019, 04/09/2023 FINDINGS: Cardiovascular: The heart is unremarkable without pericardial effusion. No evidence of thoracic aortic aneurysm or dissection. Mediastinum/Nodes: No enlarged mediastinal, hilar, or  axillary lymph nodes. Thyroid gland, trachea, and esophagus demonstrate no significant findings. Lungs/Pleura: No acute airspace disease, effusion, or pneumothorax. Stable subpleural right lower lobe scarring within the medial right costophrenic angle. Central airways are widely patent. Upper Abdomen: No acute abnormality. Musculoskeletal: No acute or destructive bony abnormalities. Reconstructed images demonstrate no additional findings. IMPRESSION: 1. No acute intrathoracic process. No CT findings to explain  the patient's hemoptysis. Electronically Signed   By: Sharlet Salina M.D.   On: 04/09/2023 19:47   DG Chest 2 View  Result Date: 04/09/2023 CLINICAL DATA:  42 year old male with hemoptysis.  History of TB. EXAM: CHEST - 2 VIEW COMPARISON:  Portable chest 11/15/2022 and earlier. FINDINGS: PA and lateral views 1625 hours. Stable lung volumes. Mediastinal contours remain normal. Visualized tracheal air column is within normal limits. Lung markings appear stable and within normal limits. No pneumothorax, pulmonary edema, pleural effusion or confluent lung opacity. No acute osseous abnormality identified. Chronic nipple piercings. Negative visible bowel gas. IMPRESSION: No acute cardiopulmonary abnormality. Electronically Signed   By: Odessa Fleming M.D.   On: 04/09/2023 17:37   PROCEDURES: Critical Care performed: No .1-3 Lead EKG Interpretation  Performed by: Merwyn Katos, MD Authorized by: Merwyn Katos, MD     Interpretation: normal     ECG rate:  71   ECG rate assessment: normal     Rhythm: sinus rhythm     Ectopy: none     Conduction: normal    MEDICATIONS ORDERED IN ED: Medications  iohexol (OMNIPAQUE) 300 MG/ML solution 75 mL (75 mLs Intravenous Contrast Given 04/09/23 1821)   IMPRESSION / MDM / ASSESSMENT AND PLAN / ED COURSE  I reviewed the triage vital signs and the nursing notes.                             The patient is on the cardiac monitor to evaluate for evidence of arrhythmia and/or significant heart rate changes. Patient's presentation is most consistent with acute presentation with potential threat to life or bodily function. While most commonly idiopathic, patient's presentation appears ML Not on anticoagulation. No h/o tracheostomy to invade the innominate artery.  Workup: CXR, CT of the chest with contrast  Given History, Exam, and Workup presentation most consistent with Low Risk  Hemoptysis 2/2 coughing in setting of transient airway inflammation. I have a low  suspicion for diffuse alveolar hemorrhage (drug induced, procedure induced, infectious, or other), pulmonary TB, PE causing distal lung necrosis, vasculitis.  Disposition: Discharge, Discussed return precautions and prompt follow up with PCP within next 48 hours.   FINAL CLINICAL IMPRESSION(S) / ED DIAGNOSES   Final diagnoses:  Hemoptysis   Rx / DC Orders   ED Discharge Orders     None      Note:  This document was prepared using Dragon voice recognition software and may include unintentional dictation errors.   Merwyn Katos, MD 04/09/23 2117

## 2023-04-09 NOTE — ED Triage Notes (Signed)
Pt via POV from home. Pt c/o hemoptysis today. Denies SOB or fever. Pt has a hx of TB, states he gets routine checks and TB reevals come out fine. Pt has a chronic cough and is a smoker. Pt is A&Ox4 and NAD

## 2023-04-10 DIAGNOSIS — J069 Acute upper respiratory infection, unspecified: Secondary | ICD-10-CM | POA: Diagnosis not present

## 2023-04-10 DIAGNOSIS — J029 Acute pharyngitis, unspecified: Secondary | ICD-10-CM | POA: Diagnosis not present

## 2023-04-10 DIAGNOSIS — J40 Bronchitis, not specified as acute or chronic: Secondary | ICD-10-CM | POA: Diagnosis not present

## 2023-04-10 DIAGNOSIS — R059 Cough, unspecified: Secondary | ICD-10-CM | POA: Diagnosis not present

## 2023-04-10 DIAGNOSIS — R0981 Nasal congestion: Secondary | ICD-10-CM | POA: Diagnosis not present

## 2023-04-11 DIAGNOSIS — R0981 Nasal congestion: Secondary | ICD-10-CM | POA: Diagnosis not present

## 2023-04-11 DIAGNOSIS — J40 Bronchitis, not specified as acute or chronic: Secondary | ICD-10-CM | POA: Diagnosis not present

## 2023-04-11 DIAGNOSIS — R042 Hemoptysis: Secondary | ICD-10-CM | POA: Diagnosis not present

## 2023-04-11 DIAGNOSIS — J029 Acute pharyngitis, unspecified: Secondary | ICD-10-CM | POA: Diagnosis not present

## 2023-04-11 DIAGNOSIS — J069 Acute upper respiratory infection, unspecified: Secondary | ICD-10-CM | POA: Diagnosis not present

## 2023-04-11 DIAGNOSIS — R059 Cough, unspecified: Secondary | ICD-10-CM | POA: Diagnosis not present

## 2023-04-11 DIAGNOSIS — F17210 Nicotine dependence, cigarettes, uncomplicated: Secondary | ICD-10-CM | POA: Diagnosis not present

## 2023-04-14 DIAGNOSIS — R059 Cough, unspecified: Secondary | ICD-10-CM | POA: Diagnosis not present

## 2023-04-14 DIAGNOSIS — J029 Acute pharyngitis, unspecified: Secondary | ICD-10-CM | POA: Diagnosis not present

## 2023-04-14 DIAGNOSIS — J984 Other disorders of lung: Secondary | ICD-10-CM | POA: Diagnosis not present

## 2023-04-14 DIAGNOSIS — F17210 Nicotine dependence, cigarettes, uncomplicated: Secondary | ICD-10-CM | POA: Diagnosis not present

## 2023-04-14 DIAGNOSIS — R0981 Nasal congestion: Secondary | ICD-10-CM | POA: Diagnosis not present

## 2023-04-14 DIAGNOSIS — J189 Pneumonia, unspecified organism: Secondary | ICD-10-CM | POA: Diagnosis not present

## 2023-04-17 DIAGNOSIS — J189 Pneumonia, unspecified organism: Secondary | ICD-10-CM | POA: Diagnosis not present

## 2023-04-17 DIAGNOSIS — R918 Other nonspecific abnormal finding of lung field: Secondary | ICD-10-CM | POA: Diagnosis not present

## 2023-04-17 DIAGNOSIS — Z227 Latent tuberculosis: Secondary | ICD-10-CM | POA: Diagnosis not present

## 2023-04-17 DIAGNOSIS — R000 Tachycardia, unspecified: Secondary | ICD-10-CM | POA: Diagnosis not present

## 2023-04-17 DIAGNOSIS — R042 Hemoptysis: Secondary | ICD-10-CM | POA: Diagnosis not present

## 2023-04-17 DIAGNOSIS — R059 Cough, unspecified: Secondary | ICD-10-CM | POA: Diagnosis not present

## 2023-04-18 ENCOUNTER — Ambulatory Visit: Payer: 59 | Admitting: Family Medicine

## 2023-04-18 DIAGNOSIS — R918 Other nonspecific abnormal finding of lung field: Secondary | ICD-10-CM | POA: Diagnosis not present

## 2023-04-18 DIAGNOSIS — R000 Tachycardia, unspecified: Secondary | ICD-10-CM | POA: Diagnosis not present

## 2023-04-21 IMAGING — CR DG HAND COMPLETE 3+V*L*
1 series · 3 of 3 positions shown · non-contrast
Comparison: None.

CLINICAL DATA: 39-year-old male status post splinter injury 1 month
ago with continued pain and swelling. Lesion between the 2nd and 3rd
distal metacarpals on the palmar aspect.

EXAM:
LEFT HAND - COMPLETE 3+ VIEW

[Series 1: dg hand complete left · 0.14mm/px · 3 of 3 slices shown]
[im 1/3]
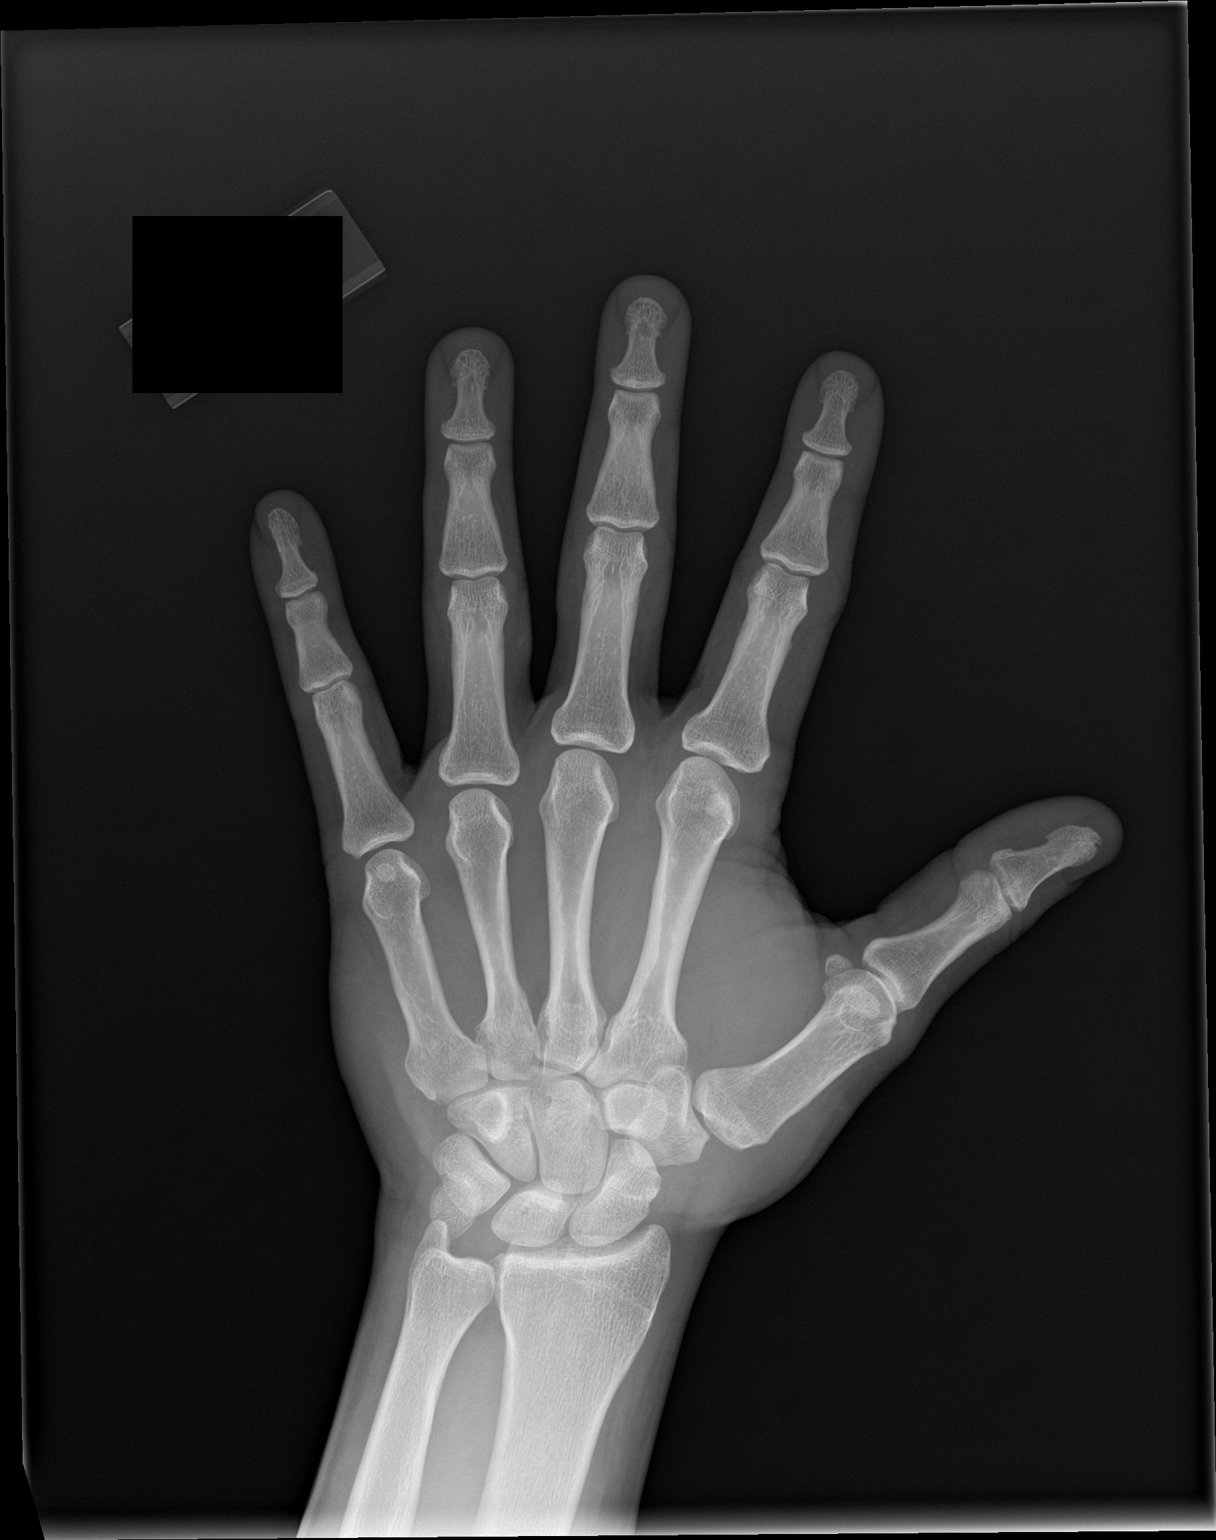
[im 2/3]
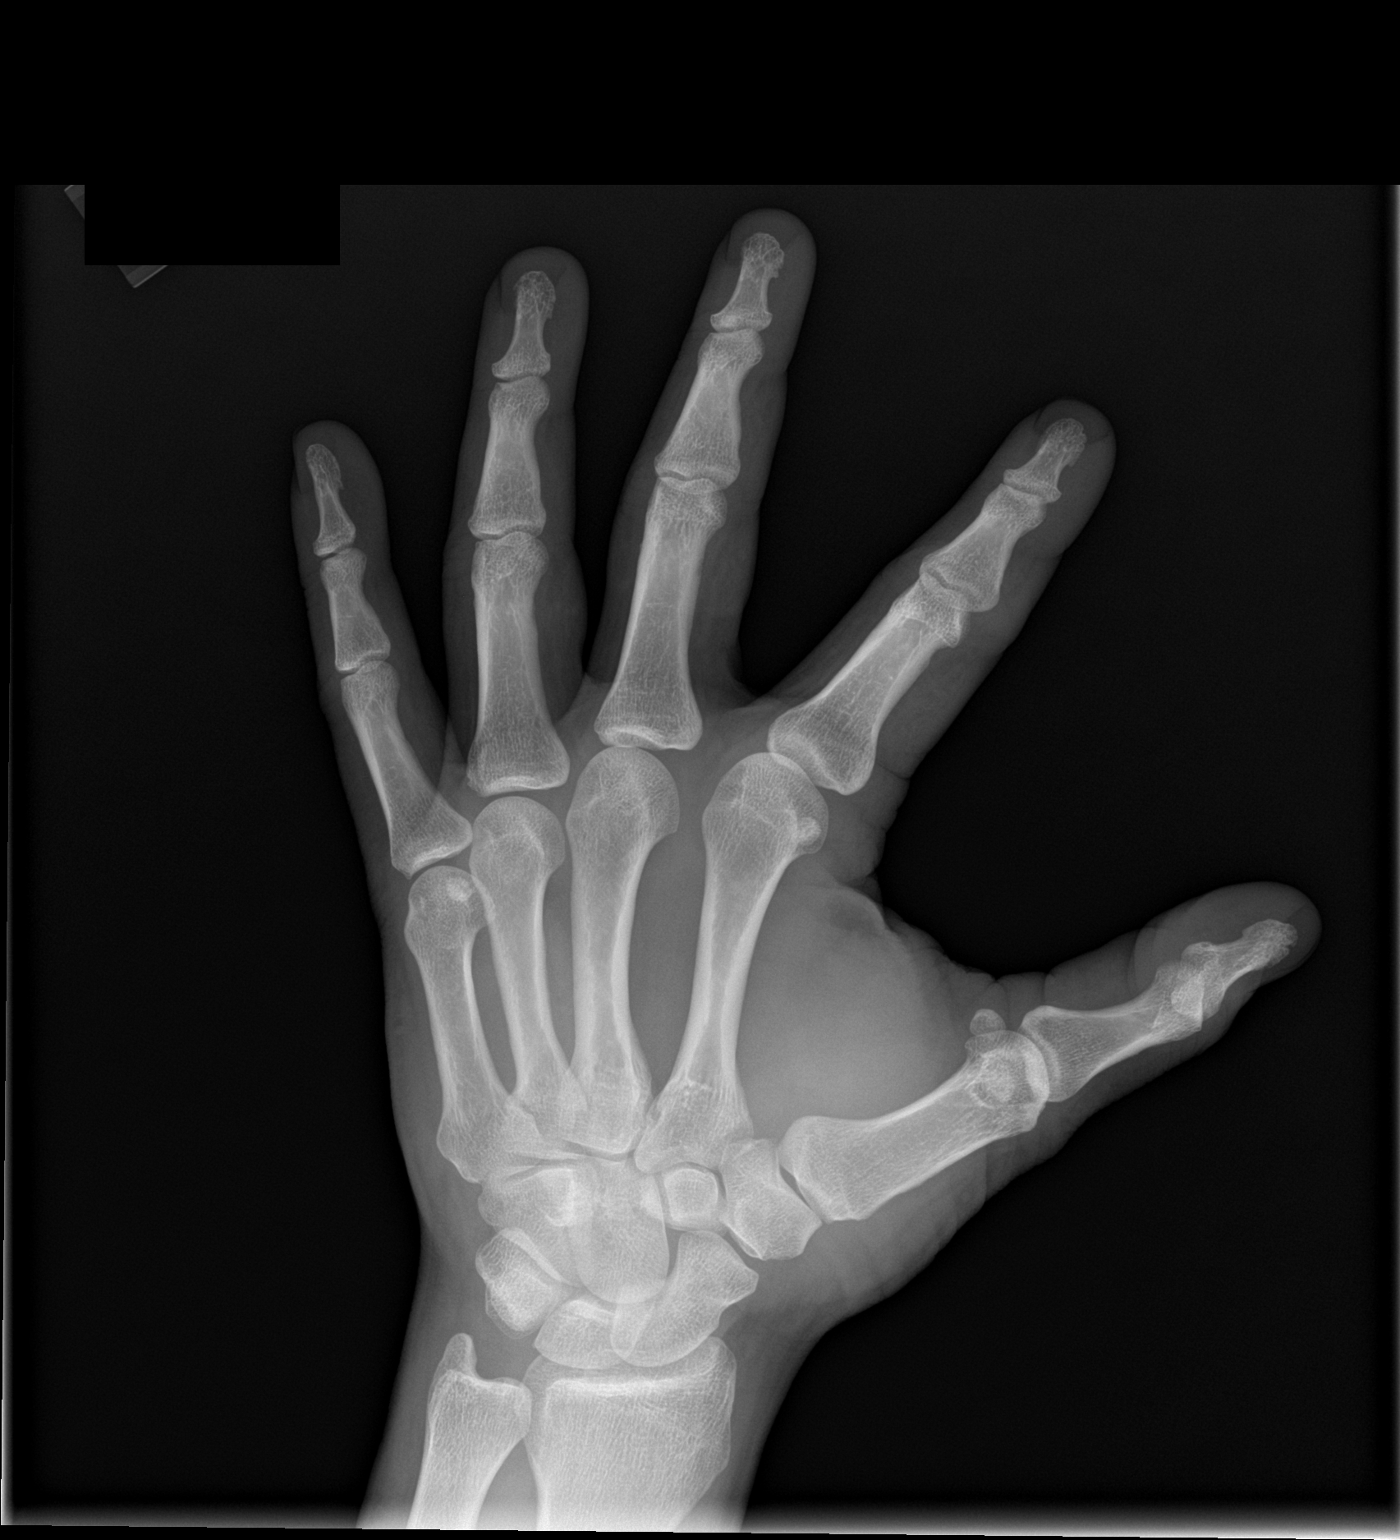
[im 3/3]
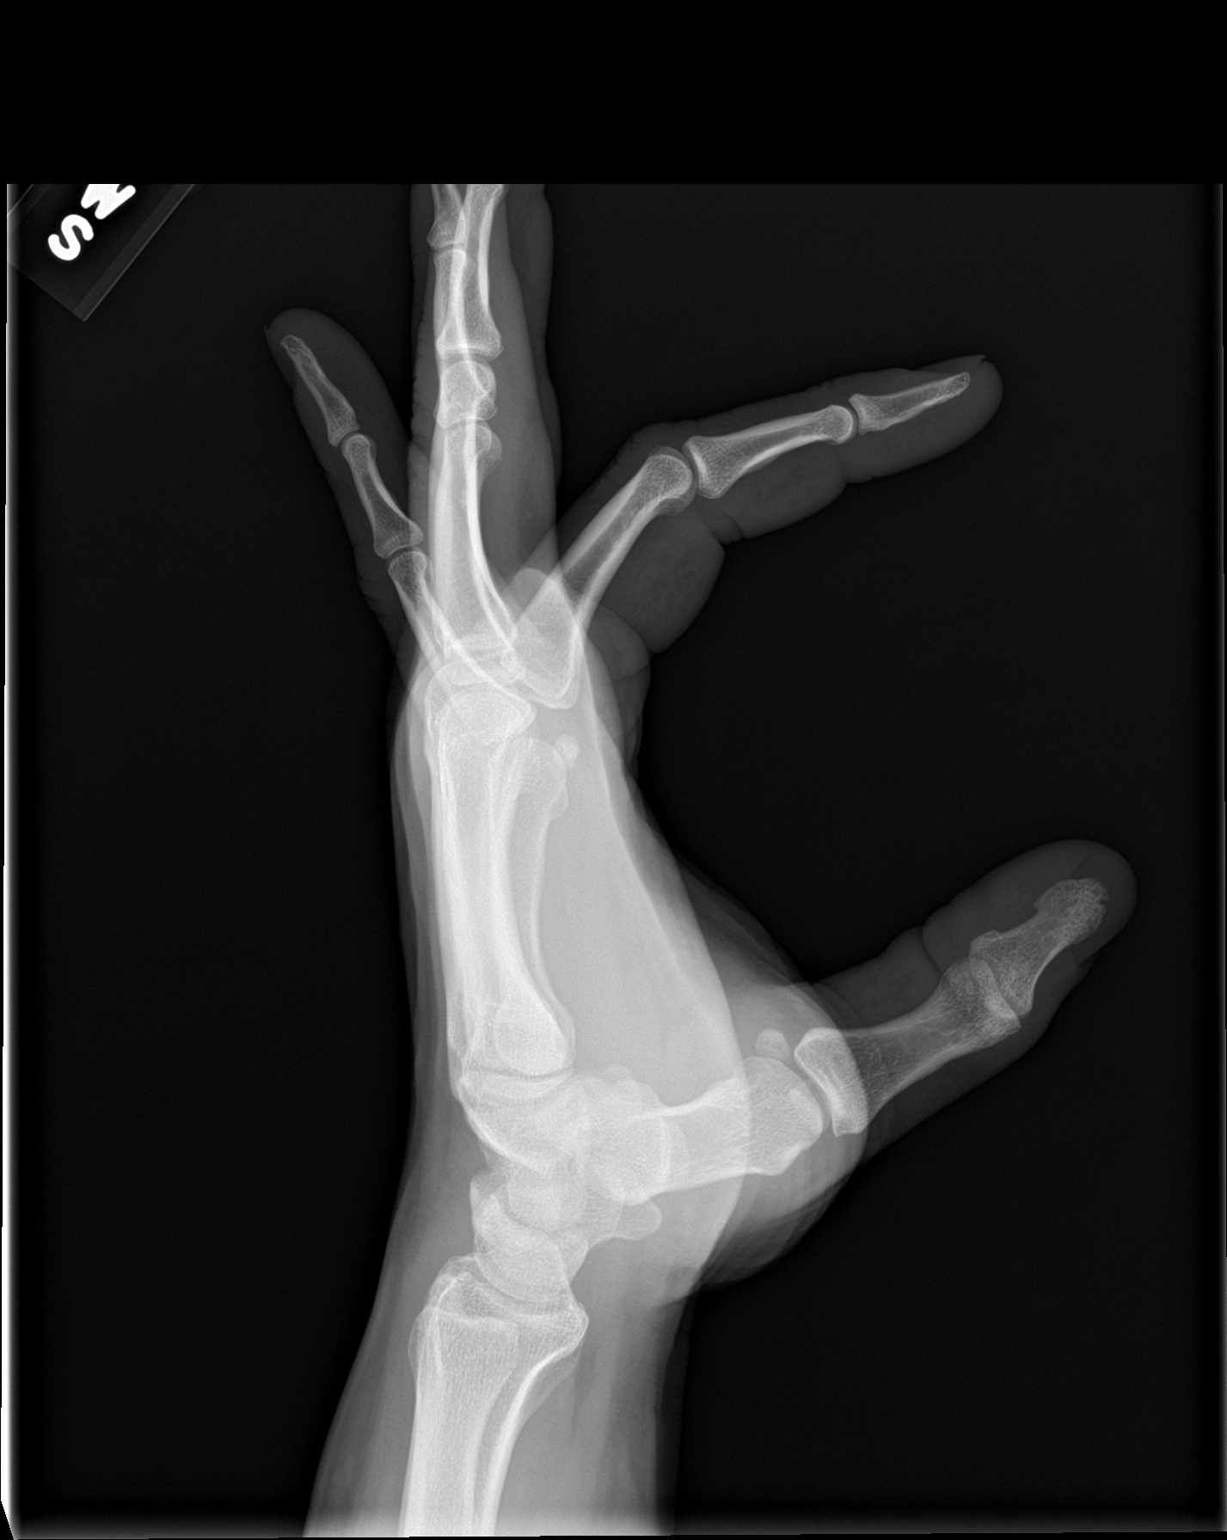

[3 of 3 positions shown; findings below may reference images not displayed]

FINDINGS: No radiopaque foreign body identified. No soft tissue gas. No
osseous abnormality identified. Normal joint spaces and alignment.
IMPRESSION: Negative. No foreign body identified but Ardenit is generally not
radiopaque.

## 2023-04-24 ENCOUNTER — Ambulatory Visit: Payer: Self-pay | Admitting: Pediatrics

## 2023-05-23 ENCOUNTER — Ambulatory Visit: Payer: Self-pay | Admitting: Pediatrics

## 2023-07-29 DIAGNOSIS — D509 Iron deficiency anemia, unspecified: Secondary | ICD-10-CM | POA: Diagnosis not present

## 2023-07-29 DIAGNOSIS — C182 Malignant neoplasm of ascending colon: Secondary | ICD-10-CM | POA: Diagnosis not present

## 2023-07-29 DIAGNOSIS — J101 Influenza due to other identified influenza virus with other respiratory manifestations: Secondary | ICD-10-CM | POA: Diagnosis not present

## 2023-07-29 DIAGNOSIS — E785 Hyperlipidemia, unspecified: Secondary | ICD-10-CM | POA: Diagnosis not present

## 2023-08-05 DIAGNOSIS — R5383 Other fatigue: Secondary | ICD-10-CM | POA: Diagnosis not present

## 2023-08-05 DIAGNOSIS — R059 Cough, unspecified: Secondary | ICD-10-CM | POA: Diagnosis not present

## 2023-08-05 DIAGNOSIS — R052 Subacute cough: Secondary | ICD-10-CM | POA: Diagnosis not present

## 2023-08-05 DIAGNOSIS — J189 Pneumonia, unspecified organism: Secondary | ICD-10-CM | POA: Diagnosis not present

## 2023-08-05 DIAGNOSIS — R918 Other nonspecific abnormal finding of lung field: Secondary | ICD-10-CM | POA: Diagnosis not present

## 2023-08-08 DIAGNOSIS — J168 Pneumonia due to other specified infectious organisms: Secondary | ICD-10-CM | POA: Diagnosis not present

## 2023-08-08 DIAGNOSIS — J189 Pneumonia, unspecified organism: Secondary | ICD-10-CM | POA: Diagnosis not present
